# Patient Record
Sex: Female | Born: 1945
Health system: Southern US, Community
[De-identification: ages and names within clinical notes are randomized; demographics above are authoritative.]

## PROBLEM LIST (undated history)

## (undated) DIAGNOSIS — Z8619 Personal history of other infectious and parasitic diseases: Secondary | ICD-10-CM

## (undated) DIAGNOSIS — Z98811 Dental restoration status: Secondary | ICD-10-CM

## (undated) DIAGNOSIS — M65832 Other synovitis and tenosynovitis, left forearm: Secondary | ICD-10-CM

## (undated) DIAGNOSIS — E785 Hyperlipidemia, unspecified: Secondary | ICD-10-CM

## (undated) HISTORY — PX: TENDON REPAIR: SHX5111

## (undated) HISTORY — PX: REPAIR ANKLE LIGAMENT: SUR1187

## (undated) HISTORY — PX: INCONTINENCE SURGERY: SHX676

## (undated) HISTORY — DX: Hyperlipidemia, unspecified: E78.5

## (undated) HISTORY — PX: KNEE ARTHROSCOPY W/ MENISCAL REPAIR: SHX1877

## (undated) HISTORY — PX: EYE FOREIGN BODY REMOVAL: SHX1562

---

## 2008-08-28 LAB — HM COLONOSCOPY: HM Colonoscopy: NORMAL

## 2011-02-10 ENCOUNTER — Ambulatory Visit (INDEPENDENT_AMBULATORY_CARE_PROVIDER_SITE_OTHER): Payer: BC Managed Care – PPO | Admitting: Family Medicine

## 2011-02-10 ENCOUNTER — Encounter: Payer: Self-pay | Admitting: Family Medicine

## 2011-02-10 DIAGNOSIS — IMO0001 Reserved for inherently not codable concepts without codable children: Secondary | ICD-10-CM | POA: Insufficient documentation

## 2011-02-10 DIAGNOSIS — J309 Allergic rhinitis, unspecified: Secondary | ICD-10-CM | POA: Insufficient documentation

## 2011-02-10 DIAGNOSIS — R030 Elevated blood-pressure reading, without diagnosis of hypertension: Secondary | ICD-10-CM

## 2011-02-10 DIAGNOSIS — E785 Hyperlipidemia, unspecified: Secondary | ICD-10-CM

## 2011-02-10 LAB — BASIC METABOLIC PANEL
CO2: 28 mEq/L (ref 19–32)
Calcium: 9.5 mg/dL (ref 8.4–10.5)
Glucose, Bld: 81 mg/dL (ref 70–99)
Sodium: 142 mEq/L (ref 135–145)

## 2011-02-10 LAB — LIPID PANEL
HDL: 45.8 mg/dL (ref >39.00)
LDL Cholesterol: 104 mg/dL — ABNORMAL HIGH (ref 0–99)
Total CHOL/HDL Ratio: 4
VLDL: 24.2 mg/dL (ref 0.0–40.0)

## 2011-02-10 LAB — CBC WITH DIFFERENTIAL/PLATELET
Eosinophils Relative: 1.7 % (ref 0.0–5.0)
HCT: 36.2 % (ref 36.0–46.0)
Hemoglobin: 12.6 g/dL (ref 12.0–15.0)
Lymphs Abs: 2.3 10*3/uL (ref 0.7–4.0)
Monocytes Relative: 8.7 % (ref 3.0–12.0)
Neutro Abs: 2.2 10*3/uL (ref 1.4–7.7)
RBC: 4.15 Mil/uL (ref 3.87–5.11)
WBC: 5 10*3/uL (ref 4.5–10.5)

## 2011-02-10 LAB — HEPATIC FUNCTION PANEL
Albumin: 3.8 g/dL (ref 3.5–5.2)
Alkaline Phosphatase: 42 U/L (ref 39–117)
Total Protein: 6.4 g/dL (ref 6.0–8.3)

## 2011-02-10 MED ORDER — FLUTICASONE PROPIONATE 50 MCG/ACT NA SUSP: 2.0000 | Freq: Every day | NASAL | Status: DC

## 2011-02-10 MED ORDER — ATORVASTATIN CALCIUM 10 MG PO TABS: 10.0000 mg | ORAL_TABLET | Freq: Every day | ORAL | Status: DC

## 2011-02-10 NOTE — Assessment & Plan Note (Signed)
BP is borderline today.  Pt is asymptomatic.  Will follow closely and start meds prn.

## 2011-02-10 NOTE — Assessment & Plan Note (Signed)
This is cause of pt's hoarseness and cough.  Start nasal spray daily.  OTC zyrtec.  Reviewed supportive care and red flags that should prompt return.  Pt expressed understanding and is in agreement w/ plan.

## 2011-02-10 NOTE — Progress Notes (Signed)
  Subjective:    Patient ID: Kristen Huffman, female    DOB: 23-Jul-1946, 65 y.o.   MRN: [id number]  HPI New to establish.  Previous MD- [facility].  GYN- [facility], UTD on pap, mammo.  Colonoscopy 2010.  Hyperlipidemia- chronic problem for pt, on Lipitor.  Due for labs.  No abd pain, N/V, myalgias  HTN- new problem for pt, was started on Lisinopril HCT by previous MD and this caused her palms to itch.  Pt reports she has never had problems w/ BP.  No CP, SOB, HAs, visual changes, edema.  Rhinitis- reports she has had sxs since November.  'i've had 3 shots, a round of abx, a box of allergy pills and nothing is working'.  Having hoarseness, nasal congestion, PND, fullness in ears.  No fevers or chills.  Previously on Fluticasone- uses sparingly.   Review of Systems For ROS see HPI     Objective:   Physical Exam  Constitutional: She appears well-developed and well-nourished. No distress.  HENT:  Head: Normocephalic and atraumatic.  Right Ear: Tympanic membrane normal.  Left Ear: Tympanic membrane normal.  Nose: Mucosal edema and rhinorrhea present. No sinus tenderness. Right sinus exhibits no maxillary sinus tenderness and no frontal sinus tenderness. Left sinus exhibits no maxillary sinus tenderness and no frontal sinus tenderness.  Mouth/Throat: Mucous membranes are normal.       + PND, hoarseness  Eyes: Conjunctivae and EOM are normal. Pupils are equal, round, and reactive to light.  Neck: Normal range of motion. Neck supple. No thyromegaly present.  Cardiovascular: Normal rate, regular rhythm, normal heart sounds and intact distal pulses.   No murmur heard. Pulmonary/Chest: Effort normal and breath sounds normal. No respiratory distress. She has no wheezes. She has no rales.  Abdominal: Soft. Bowel sounds are normal. She exhibits no distension. There is no tenderness.  Musculoskeletal: She exhibits no edema.  Lymphadenopathy:    She has no cervical adenopathy.  Neurological: She is  alert. She has normal reflexes. No cranial nerve deficit.  Skin: Skin is warm and dry.          Assessment & Plan:

## 2011-02-10 NOTE — Assessment & Plan Note (Signed)
Due for labs today.  Adjust meds based on results.

## 2011-02-10 NOTE — Patient Instructions (Signed)
Follow up in 3 months to recheck blood pressure Use the Flonase EVERY DAY!!! Take Zyrtec daily for allergy symptoms We'll notify you of your lab results Call with any questions or concerns Hang in there!!!

## 2011-02-11 ENCOUNTER — Encounter: Payer: Self-pay | Admitting: *Deleted

## 2011-03-04 ENCOUNTER — Ambulatory Visit: Payer: BC Managed Care – PPO | Admitting: Family Medicine

## 2011-03-05 ENCOUNTER — Ambulatory Visit (INDEPENDENT_AMBULATORY_CARE_PROVIDER_SITE_OTHER): Payer: BC Managed Care – PPO | Admitting: Family Medicine

## 2011-03-05 ENCOUNTER — Encounter: Payer: Self-pay | Admitting: Family Medicine

## 2011-03-05 VITALS — BP 120/80 | Temp 97.8°F | Wt 160.2 lb

## 2011-03-05 DIAGNOSIS — J309 Allergic rhinitis, unspecified: Secondary | ICD-10-CM

## 2011-03-05 DIAGNOSIS — R300 Dysuria: Secondary | ICD-10-CM

## 2011-03-05 LAB — POCT URINALYSIS DIPSTICK
Leukocytes, UA: NEGATIVE
Nitrite, UA: NEGATIVE
Protein, UA: NEGATIVE
Urobilinogen, UA: 0.2
pH, UA: 5

## 2011-03-05 NOTE — Assessment & Plan Note (Signed)
UA normal.  No evidence of infxn.  No abx at this time.  Encouraged increased fluids.

## 2011-03-05 NOTE — Patient Instructions (Signed)
Follow up in 3 months to recheck blood pressure There is no sign of bladder infection Continue the Zyrtec and Nasonex daily Call with any questions or concerns Have a great summer!

## 2011-03-05 NOTE — Progress Notes (Signed)
  Subjective:    Patient ID: Kristen Huffman, female    DOB: 20-Nov-1945, 65 y.o.   MRN: [id number]  HPI Allergic rhinitis- sxs have improved since starting nasal spray and taking zyrtec regularly.  sxs are still bad at night and 1st thing in AM.  ? UTI- had sensation of needing to void this AM but did not urinate, had some 'stinging'.  Denies frequency, urgency.  Review of Systems For ROS see HPI     Objective:   Physical Exam  Constitutional: She appears well-developed and well-nourished. No distress.  HENT:  Head: Normocephalic and atraumatic.  Mouth/Throat: Oropharynx is clear and moist.       Less edematous turbinates  Eyes: Conjunctivae and EOM are normal. Pupils are equal, round, and reactive to light.  Abdominal: Soft. Bowel sounds are normal. She exhibits no distension. There is no tenderness (no suprapubic or CVA).          Assessment & Plan:

## 2011-03-05 NOTE — Assessment & Plan Note (Signed)
Pt's sxs are improving w/ daily tx.  Pt to continue current meds.  Will follow.

## 2011-08-13 ENCOUNTER — Ambulatory Visit: Payer: BC Managed Care – PPO | Admitting: Family Medicine

## 2011-08-24 ENCOUNTER — Ambulatory Visit: Payer: BC Managed Care – PPO | Admitting: Family Medicine

## 2011-08-27 ENCOUNTER — Ambulatory Visit: Payer: BC Managed Care – PPO | Admitting: Family Medicine

## 2011-09-03 ENCOUNTER — Telehealth: Payer: Self-pay | Admitting: *Deleted

## 2011-09-03 ENCOUNTER — Ambulatory Visit (INDEPENDENT_AMBULATORY_CARE_PROVIDER_SITE_OTHER): Payer: BC Managed Care – PPO | Admitting: Family Medicine

## 2011-09-03 ENCOUNTER — Encounter: Payer: Self-pay | Admitting: Family Medicine

## 2011-09-03 VITALS — BP 122/80 | HR 60 | Temp 97.9°F | Ht 66.0 in | Wt 163.8 lb

## 2011-09-03 DIAGNOSIS — E785 Hyperlipidemia, unspecified: Secondary | ICD-10-CM

## 2011-09-03 DIAGNOSIS — Z23 Encounter for immunization: Secondary | ICD-10-CM

## 2011-09-03 DIAGNOSIS — R030 Elevated blood-pressure reading, without diagnosis of hypertension: Secondary | ICD-10-CM

## 2011-09-03 DIAGNOSIS — Z0000 Encounter for general adult medical examination without abnormal findings: Secondary | ICD-10-CM

## 2011-09-03 LAB — CBC WITH DIFFERENTIAL/PLATELET
Eosinophils Absolute: 0.1 10*3/uL (ref 0.0–0.7)
Eosinophils Relative: 2 % (ref 0–5)
HCT: 40.9 % (ref 36.0–46.0)
Lymphocytes Relative: 41 % (ref 12–46)
Lymphs Abs: 2 10*3/uL (ref 0.7–4.0)
MCH: 28.2 pg (ref 26.0–34.0)
MCV: 86.8 fL (ref 78.0–100.0)
Monocytes Absolute: 0.5 10*3/uL (ref 0.1–1.0)
Platelets: 204 10*3/uL (ref 150–400)
RBC: 4.71 MIL/uL (ref 3.87–5.11)
WBC: 4.9 10*3/uL (ref 4.0–10.5)

## 2011-09-03 LAB — BASIC METABOLIC PANEL
BUN: 16 mg/dL (ref 6–23)
CO2: 25 mEq/L (ref 19–32)
Calcium: 9.7 mg/dL (ref 8.4–10.5)
Chloride: 106 mEq/L (ref 96–112)
Creat: 0.62 mg/dL (ref 0.50–1.10)
Glucose, Bld: 73 mg/dL (ref 70–99)

## 2011-09-03 LAB — HEPATIC FUNCTION PANEL
ALT: 19 U/L (ref 0–35)
Albumin: 4.3 g/dL (ref 3.5–5.2)
Total Protein: 6.8 g/dL (ref 6.0–8.3)

## 2011-09-03 LAB — LIPID PANEL
Cholesterol: 184 mg/dL (ref 0–200)
Triglycerides: 104 mg/dL (ref <150)
VLDL: 21 mg/dL (ref 0–40)

## 2011-09-03 LAB — TSH: TSH: 1.52 u[IU]/mL (ref 0.350–4.500)

## 2011-09-03 NOTE — Assessment & Plan Note (Signed)
Normal BP today.  Asymptomatic.  No need for meds.

## 2011-09-03 NOTE — Telephone Encounter (Signed)
refaxed release for pt information to be sent to [place] from [facility].

## 2011-09-03 NOTE — Progress Notes (Signed)
  Subjective:    Patient ID: Kristen Huffman, female    DOB: [birth year], 65 y.o.   MRN: [id number]  HPI Hyperlipidemia- chronic problem, on Lipitor nightly.  No abd pain, N/V, myalgias.  Hx of hepatitis.  Elevated BP- pt had been started on ACE by previous MD but had itching from this.  Since med was stopped she has not had elevated BP.  Denies CP, SOB, HAs, visual changes, edema.   Review of Systems For ROS see HPI     Objective:   Physical Exam  Vitals reviewed. Constitutional: She is oriented to person, place, and time. She appears well-developed and well-nourished. No distress.  HENT:  Head: Normocephalic and atraumatic.  Eyes: Conjunctivae and EOM are normal. Pupils are equal, round, and reactive to light.  Neck: Normal range of motion. Neck supple. No thyromegaly present.  Cardiovascular: Normal rate, regular rhythm, normal heart sounds and intact distal pulses.   No murmur heard. Pulmonary/Chest: Effort normal and breath sounds normal. No respiratory distress.  Abdominal: Soft. She exhibits no distension. There is no tenderness.  Musculoskeletal: She exhibits no edema.  Lymphadenopathy:    She has no cervical adenopathy.  Neurological: She is alert and oriented to person, place, and time.  Skin: Skin is warm and dry.  Psychiatric: She has a normal mood and affect. Her behavior is normal.          Assessment & Plan:

## 2011-09-03 NOTE — Assessment & Plan Note (Signed)
Chronic problem.  On statin w/out difficulty.  Check labs.  Adjust meds prn

## 2011-09-03 NOTE — Patient Instructions (Signed)
Schedule your complete physical at your convenience- you can eat! We'll notify you of your lab results and make any changes if needed You look great!  Keep up the good work! Call with any questions or concerns Happy Holidays!!

## 2011-09-06 ENCOUNTER — Other Ambulatory Visit: Payer: Self-pay | Admitting: Family Medicine

## 2011-09-07 NOTE — Telephone Encounter (Signed)
rx sent to pharmacy by e-script  

## 2011-09-10 ENCOUNTER — Encounter: Payer: Self-pay | Admitting: *Deleted

## 2011-11-02 ENCOUNTER — Other Ambulatory Visit: Payer: Self-pay | Admitting: Family Medicine

## 2011-11-02 MED ORDER — PANTOPRAZOLE SODIUM 40 MG PO TBEC: 40.0000 mg | DELAYED_RELEASE_TABLET | Freq: Every day | ORAL | Status: DC

## 2011-11-02 NOTE — Telephone Encounter (Signed)
Called pt to verify whom had prescribed the medication per did not note the medication in epic or centricity med list, pt advised that she has been on this medication for years per prior MD, MD Beverely Low advised we can send medication for pt, sent to verified pharmacy via escribe

## 2011-11-05 ENCOUNTER — Telehealth: Payer: Self-pay | Admitting: *Deleted

## 2011-11-05 NOTE — Telephone Encounter (Signed)
Left message to call office to see if Pt has tried any OTC meds like: Prilosec/omeprazole, zantac, Tums, aciphex or any other med for acid reflux/GERD.  Awaiting PA fax form.

## 2011-11-06 NOTE — Telephone Encounter (Signed)
Left message to call office

## 2011-11-10 NOTE — Telephone Encounter (Signed)
Pt indicated that she has tried tums, aciphex, and nexium with no relief. Pt also notes that med was originally Rx for GERD.PA faxed awaiting response.

## 2011-11-11 ENCOUNTER — Telehealth: Payer: Self-pay | Admitting: Family Medicine

## 2011-11-23 NOTE — Telephone Encounter (Signed)
Prior Auth approved 10-20-11 until 11-09-12.

## 2012-01-06 ENCOUNTER — Other Ambulatory Visit: Payer: Self-pay | Admitting: Family Medicine

## 2012-01-06 MED ORDER — ATORVASTATIN CALCIUM 10 MG PO TABS: 10.0000 mg | ORAL_TABLET | Freq: Every day | ORAL | Status: DC

## 2012-01-06 NOTE — Telephone Encounter (Signed)
rx sent to pharmacy by e-script  

## 2012-01-06 NOTE — Telephone Encounter (Signed)
Refill for  Atorvastatin 10MG  Tablet Qty 30 Take 1-tablet every day  Last filled 3.11.13   Last OV 12.6.12

## 2012-03-07 ENCOUNTER — Ambulatory Visit (INDEPENDENT_AMBULATORY_CARE_PROVIDER_SITE_OTHER): Payer: BC Managed Care – PPO | Admitting: Family Medicine

## 2012-03-07 ENCOUNTER — Other Ambulatory Visit: Payer: Self-pay | Admitting: Family Medicine

## 2012-03-07 ENCOUNTER — Encounter: Payer: Self-pay | Admitting: Family Medicine

## 2012-03-07 VITALS — BP 124/78 | HR 62 | Temp 97.9°F | Ht 65.5 in | Wt 163.0 lb

## 2012-03-07 DIAGNOSIS — E785 Hyperlipidemia, unspecified: Secondary | ICD-10-CM

## 2012-03-07 DIAGNOSIS — Z2911 Encounter for prophylactic immunotherapy for respiratory syncytial virus (RSV): Secondary | ICD-10-CM

## 2012-03-07 DIAGNOSIS — Z0000 Encounter for general adult medical examination without abnormal findings: Secondary | ICD-10-CM

## 2012-03-07 DIAGNOSIS — K219 Gastro-esophageal reflux disease without esophagitis: Secondary | ICD-10-CM

## 2012-03-07 LAB — BASIC METABOLIC PANEL
CO2: 28 mEq/L (ref 19–32)
Glucose, Bld: 79 mg/dL (ref 70–99)
Potassium: 3.9 mEq/L (ref 3.5–5.1)
Sodium: 144 mEq/L (ref 135–145)

## 2012-03-07 LAB — LIPID PANEL
HDL: 43.2 mg/dL (ref >39.00)
LDL Cholesterol: 117 mg/dL — ABNORMAL HIGH (ref 0–99)
Total CHOL/HDL Ratio: 4
Triglycerides: 118 mg/dL (ref 0.0–149.0)

## 2012-03-07 LAB — CBC WITH DIFFERENTIAL/PLATELET
Basophils Absolute: 0.1 10*3/uL (ref 0.0–0.1)
Eosinophils Relative: 2.8 % (ref 0.0–5.0)
HCT: 38.2 % (ref 36.0–46.0)
Lymphocytes Relative: 41.9 % (ref 12.0–46.0)
Monocytes Relative: 9 % (ref 3.0–12.0)
Neutrophils Relative %: 45.2 % (ref 43.0–77.0)
Platelets: 183 10*3/uL (ref 150.0–400.0)
RDW: 13.4 % (ref 11.5–14.6)
WBC: 5.2 10*3/uL (ref 4.5–10.5)

## 2012-03-07 LAB — HEPATIC FUNCTION PANEL
AST: 25 U/L (ref 0–37)
Albumin: 4 g/dL (ref 3.5–5.2)
Alkaline Phosphatase: 66 U/L (ref 39–117)
Bilirubin, Direct: 0.1 mg/dL (ref 0.0–0.3)
Total Protein: 6.8 g/dL (ref 6.0–8.3)

## 2012-03-07 NOTE — Patient Instructions (Signed)
Follow up in 6 months to recheck cholesterol Your exam looks great!  Keep up the good work! We'll notify you of your lab results and make any changes if needed We'll call you with your GI appt Continue the Protonix for now Call with any questions or concerns Have a great summer!

## 2012-03-07 NOTE — Assessment & Plan Note (Signed)
Chronic problem.  Check labs.  Adjust meds prn  

## 2012-03-07 NOTE — Progress Notes (Signed)
  Subjective:    Patient ID: Kristen Huffman, female    DOB: 10/11/1945, 66 y.o.   MRN: [id number]  HPI CPE- UTD on GYN screening, UTD on colonoscopy.  Desires shingles shot.   Review of Systems Patient reports no vision/ hearing changes, adenopathy,fever, weight change,  persistant/recurrent hoarseness , swallowing issues, chest pain, palpitations, edema, persistant/recurrent cough, hemoptysis, dyspnea (rest/exertional/paroxysmal nocturnal), gastrointestinal bleeding (melena, rectal bleeding), abdominal pain, bowel changes, GU symptoms (dysuria, hematuria, incontinence), Gyn symptoms (abnormal  bleeding, pain),  syncope, focal weakness, memory loss, numbness & tingling, skin/hair/nail changes, abnormal bruising or bleeding, anxiety, or depression.  + GERD- worsening sxs recently despite ongoing use of Protonix    Objective:   Physical Exam General Appearance:    Alert, cooperative, no distress, appears stated age  Head:    Normocephalic, without obvious abnormality, atraumatic  Eyes:    PERRL, conjunctiva/corneas clear, EOM's intact, fundi    benign, both eyes  Ears:    Normal TM's and external ear canals, both ears  Nose:   Nares normal, septum midline, mucosa normal, no drainage    or sinus tenderness  Throat:   Lips, mucosa, and tongue normal; teeth and gums normal  Neck:   Supple, symmetrical, trachea midline, no adenopathy;    Thyroid: no enlargement/tenderness/nodules  Back:     Symmetric, no curvature, ROM normal, no CVA tenderness  Lungs:     Clear to auscultation bilaterally, respirations unlabored  Chest Wall:    No tenderness or deformity   Heart:    Regular rate and rhythm, S1 and S2 normal, no murmur, rub   or gallop  Breast Exam:    Deferred to GYN  Abdomen:     Soft, non-tender, bowel sounds active all four quadrants,    no masses, no organomegaly  Genitalia:    Deferred to GYN  Rectal:    Extremities:   Extremities normal, atraumatic, no cyanosis or edema  Pulses:   2+  and symmetric all extremities  Skin:   Skin color, texture, turgor normal, no rashes or lesions  Lymph nodes:   Cervical, supraclavicular, and axillary nodes normal  Neurologic:   CNII-XII intact, normal strength, sensation and reflexes    throughout          Assessment & Plan:

## 2012-03-07 NOTE — Telephone Encounter (Signed)
Refill Atorvastatin 10mg  Tablet Qty 30 Take one tablet every day  Last fill 5.7.13 Last ov 6.10.13

## 2012-03-07 NOTE — Assessment & Plan Note (Signed)
Deteriorated despite use of protonix regularly.  Pt was seeing GI regularly at [facility].  Will refer to [place].  Continue PPI.  Pt expressed understanding and is in agreement w/ plan.

## 2012-03-07 NOTE — Assessment & Plan Note (Signed)
New.  Pt's PE WNL.  UTD on GYN, colonoscopy.  Check labs.  EKG done- see document for interpretation.  Anticipatory guidance provided.

## 2012-03-08 ENCOUNTER — Encounter: Payer: Self-pay | Admitting: *Deleted

## 2012-03-08 MED ORDER — ATORVASTATIN CALCIUM 10 MG PO TABS: 10.0000 mg | ORAL_TABLET | Freq: Every day | ORAL | Status: DC

## 2012-03-08 NOTE — Telephone Encounter (Signed)
rx sent to pharmacy by e-script  

## 2012-03-10 ENCOUNTER — Encounter: Payer: Self-pay | Admitting: *Deleted

## 2012-03-10 LAB — VITAMIN D 1,25 DIHYDROXY
Vitamin D 1, 25 (OH)2 Total: 49 pg/mL (ref 18–72)
Vitamin D2 1, 25 (OH)2: <8 pg/mL

## 2012-03-29 ENCOUNTER — Ambulatory Visit (INDEPENDENT_AMBULATORY_CARE_PROVIDER_SITE_OTHER): Payer: BC Managed Care – PPO | Admitting: Gastroenterology

## 2012-03-29 ENCOUNTER — Encounter: Payer: Self-pay | Admitting: Gastroenterology

## 2012-03-29 VITALS — BP 150/80 | HR 72 | Ht 66.0 in | Wt 165.0 lb

## 2012-03-29 DIAGNOSIS — Z860100 Personal history of colon polyps, unspecified: Secondary | ICD-10-CM

## 2012-03-29 DIAGNOSIS — K219 Gastro-esophageal reflux disease without esophagitis: Secondary | ICD-10-CM

## 2012-03-29 NOTE — Progress Notes (Signed)
HPI: This is a     very pleasant 66 year old woman whom I am meeting for the first time today.  She has had probably 2-3 EGDs.  He told her that if she didn't keep up with EGDs periodically she may develop Barrett's.  Two colonoscopies with Dr. Noe Gens, polyps were found.  She believes 1-2 small polyps were found, was recommended to have repeat colonoscopy in 2 year interval.  We don't have any of those records here for review. She tells me she has found it very difficult to get records from his office. She is frankly lost a lot of trust in his office and in him personally.  CBC and complete metabolic profile last month were both completely normal.  Eating caused pyrosis.  Certain food foods (spicey) will still cause problems.  Overeating can cause pyrosis..  Takes protonix daily.  Has been on PPI for 10 years.  Usually at bedtime.    Became hoarse last spring, took zyrtec without imrovement.  Recent cipro for UTI cured her chronic hoarseness.  No dypshagia.  She is gaining weight slowly or stable in her usual range.      Review of systems: Pertinent positive and negative review of systems were noted in the above HPI section. Complete review of systems was performed and was otherwise normal.    Past Medical History  Diagnosis Date  . History of chicken pox   . GERD (gastroesophageal reflux disease)   . Hyperlipidemia   . Urinary incontinence   . Hepatitis 1970    B, jaundice  . Hypertension     Past Surgical History  Procedure Date  . Incontinence surgery   . Rineck   . Uterine biopsy 1978  . Eye surgery 1968  . Broken toe     Current Outpatient Prescriptions  Medication Sig Dispense Refill  . Ascorbic Acid (VITAMIN C PO) Take 600 Units by mouth daily.        Marland Kitchen atorvastatin (LIPITOR) 10 MG tablet Take 1 tablet (10 mg total) by mouth daily.  30 tablet  5  . co-enzyme Q-10 30 MG capsule Take 30 mg by mouth daily.        . fluticasone (FLONASE) 50 MCG/ACT nasal spray  Place 2 sprays into the nose daily.      . Multiple Vitamin (MULTIVITAMIN) tablet Take 1 tablet by mouth daily.        . pantoprazole (PROTONIX) 40 MG tablet Take 1 tablet (40 mg total) by mouth daily.  30 tablet  6  . VITAMIN E PO Take 2,000 Units by mouth daily.        Marland Kitchen omega-3 acid ethyl esters (LOVAZA) 1 G capsule Take 1 g by mouth 2 (two) times daily.          Allergies as of 03/29/2012 - Review Complete 03/29/2012  Allergen Reaction Noted  . Codeine  02/10/2011  . Penicillins Hives 02/10/2011    Family History  Problem Relation Age of Onset  . Heart attack Father 50  . Hypertension Mother   . Heart failure Mother     History   Social History  . Marital Status: Married    Spouse Name: N/A    Number of Children: N/A  . Years of Education: N/A   Occupational History  . Not on file.   Social History Main Topics  . Smoking status: Former Smoker    Quit date: 09/29/1979  . Smokeless tobacco: Not on file  . Alcohol Use: No  .  Drug Use: No  . Sexually Active:    Other Topics Concern  . Not on file   Social History Narrative  . No narrative on file       Physical Exam: BP 150/80  Pulse 72  Ht 5\' 6"  (1.676 m)  Wt 165 lb (74.844 kg)  BMI 26.63 kg/m2 Constitutional: generally well-appearing Psychiatric: alert and oriented x3 Eyes: extraocular movements intact Mouth: oral pharynx moist, no lesions Neck: supple no lymphadenopathy Cardiovascular: heart regular rate and rhythm Lungs: clear to auscultation bilaterally Abdomen: soft, nontender, nondistended, no obvious ascites, no peritoneal signs, normal bowel sounds Extremities: no lower extremity edema bilaterally Skin: no lesions on visible extremities    Assessment and plan: 66 y.o. female with  chronic GERD without alarm symptoms, personal history of polyps in the colon  We're going to get records sent over from her previous gastroenterology office. She has lost faith and trust in him and in his  office. I'll review his records and determine if she needs EGDs for surveillance. Also we'll decide proper timing for colonoscopy recall. Going to have her try to cut back on her proton pump inhibitor to every other day and she will use H2 blocker as needed for symptoms, especially going out for spicy food. She will return to see me in 4-5 weeks and sooner if needed.

## 2012-03-29 NOTE — Patient Instructions (Addendum)
We are working on getting records from Dr. Rodena Piety office (colonoscopy and EGDs, including biopsy results).  Will decide on further upper lower endoscopies after reviewing. You should change the way you are taking your antiacid medicine (protonix) so that you are taking it 20-30 minutes prior to a decent meal as that is the way the pill is designed to work most effectively.  Try decreasing to every other day. Pepcid/zantac is another class of antiacid meds, very good for taking just prior a meal or right at bedtime. Return to see Dr. Christella Hartigan in 5-6 weeks to discuss your symptoms.

## 2012-04-01 ENCOUNTER — Telehealth: Payer: Self-pay

## 2012-04-01 ENCOUNTER — Telehealth: Payer: Self-pay | Admitting: Gastroenterology

## 2012-04-01 NOTE — Telephone Encounter (Signed)
Kristen Huffman is calling today and asking that records be sent ASAP

## 2012-04-01 NOTE — Telephone Encounter (Signed)
EGD December 2001 Dr. Noe Gens, handwritten EGD note that appears to state she had abnormal mucosa 2 cm above GE junction and gastritis, normal duodenum. Biopsies performed. Consistent with Barrett's esophagus without atypia. Repeat EGD December 2003 esophagitis noted in distal 4 cm of esophagus with gastritis. Biopsies showed chronic inflammation of gastric type mucosa without intestinal metaplasia. Repeat EGD December 2005 describes Barrett's esophagus for distal 4 cm of esophagus. Gastric ulcer. Biopsies showed no intestinal metaplasia and once again no H. pylori. EGD June 2006 describes exactly the same thing as in 2005 biopsies were again taken from the stomach and the esophagus the again showed no H. pylori and no intestinal metaplasia. EGD June 2009 describes Barrett's esophagus distal 4 cm, gastric polyps and gastritis. Biopsies again showed no H. pylori no intestinal metaplasia and only hyperplastic polyps. She was recommended to have a repeat EGD in either 2 or 3 years, I cannot interpret the handwritten note on the pathologic slept.  Colonoscopy December 2001 done for blood loss, 10 mm polyp from 20 cm into the sigmoid. This was a hyperplastic polyp on pathology. Repeat colonoscopy July 2009 noted another 10 mm polyp at 18 cm into the sigmoid. This was done for "personal history of colon polyps in the past" the polyp removed was again found to be hyperplastic however she was recommended to have a repeat colonoscopy in 6 months due to limited view of the colon due to inadequate prep. Colonoscopy December 2009 found 2 more small sessile polyps 8 mm and 7 mm. These were both hyperplastic and it appears he recommended repeat colonoscopy in 10 years.  [name], Please call her, I reviewed the records from Dr. Noe Gens extensive, numerous upper and lower endoscopies. She had a single biopsy showing intestinal metaplasia in 2001 however on for endoscopies since then she has not been found to have any intestinal  metaplasia. I think in truth she probably does not have Barrett's esophagus currently. I recommend she does not need any further EGD surveillances. She has had 3 colonoscopies the most recent in 2009 and has never been found to have a precancerous polyp and so I agree with Dr. Noe Gens that she repeat needs another colonoscopy December 2019 for routine risk screening.  Recall colonsocopy 08/2008

## 2012-04-01 NOTE — Telephone Encounter (Signed)
Message copied by Donata Duff on Fri Apr 01, 2012 11:12 AM ------      Message from: Donata Duff      Created: Tue Mar 29, 2012  2:45 PM       Check on records from Dr Noe Gens

## 2012-04-04 NOTE — Telephone Encounter (Signed)
Pt recall in EPIC unable to reach pt Left message on machine to call back

## 2012-04-26 ENCOUNTER — Telehealth: Payer: Self-pay | Admitting: Family Medicine

## 2012-04-26 NOTE — Telephone Encounter (Signed)
Pt has a new grandchild on the way and would like to know if she has ahd it & if not does she need it? Patient callback [phone]

## 2012-04-26 NOTE — Telephone Encounter (Signed)
Spoke to scheduler whom clarified pt needs TDAP with whopping cough, advised pt does not have this shot and can come into office to have the shot, please advise if pt can get here by 5pm this nurse will do it today

## 2012-04-28 NOTE — Telephone Encounter (Signed)
Called pt she can come 8.12.13 at 2pm - sch

## 2012-05-09 ENCOUNTER — Ambulatory Visit: Payer: BC Managed Care – PPO

## 2012-05-16 NOTE — Telephone Encounter (Signed)
No showed for appt

## 2012-05-27 ENCOUNTER — Encounter: Payer: BC Managed Care – PPO | Admitting: Family Medicine

## 2012-06-06 ENCOUNTER — Ambulatory Visit (INDEPENDENT_AMBULATORY_CARE_PROVIDER_SITE_OTHER): Payer: BC Managed Care – PPO | Admitting: Family Medicine

## 2012-06-06 ENCOUNTER — Encounter: Payer: Self-pay | Admitting: Family Medicine

## 2012-06-06 VITALS — BP 130/75 | HR 63 | Temp 98.2°F | Ht 65.75 in | Wt 161.4 lb

## 2012-06-06 DIAGNOSIS — R21 Rash and other nonspecific skin eruption: Secondary | ICD-10-CM

## 2012-06-06 DIAGNOSIS — L255 Unspecified contact dermatitis due to plants, except food: Secondary | ICD-10-CM

## 2012-06-06 DIAGNOSIS — L237 Allergic contact dermatitis due to plants, except food: Secondary | ICD-10-CM | POA: Insufficient documentation

## 2012-06-06 MED ORDER — PREDNISONE 20 MG PO TABS: ORAL_TABLET | ORAL | Status: DC

## 2012-06-06 MED ORDER — METHYLPREDNISOLONE ACETATE 80 MG/ML IJ SUSP
80.0000 mg | Freq: Once | INTRAMUSCULAR | Status: AC
  Administered 2012-06-06: 80 mg via INTRAMUSCULAR

## 2012-06-06 NOTE — Patient Instructions (Addendum)
Start the Prednisone tomorrow w/ breakfast Take all 3 pills at the same time OTC hydrocortisone cream as needed Call with any questions or concerns Hang in there!

## 2012-06-06 NOTE — Assessment & Plan Note (Signed)
New.  Due to severity and diffuse distribution will start steroid taper after depo medrol injxn today in office.  Reviewed supportive care and red flags that should prompt return.  Pt expressed understanding and is in agreement w/ plan.

## 2012-06-06 NOTE — Progress Notes (Signed)
  Subjective:    Patient ID: Kristen Huffman, female    DOB: 05-31-1946, 66 y.o.   MRN: [id number]  HPI Poison ivy- was pulling weeds on Tuesday and developed diffuse and severe rash on Wednesday/Thursday.  Very itchy on face, neck, chest, arms.  No fevers.  No drainage.   Review of Systems For ROS see HPI     Objective:   Physical Exam  Vitals reviewed. Constitutional: She appears well-developed and well-nourished. No distress.  Skin: Skin is warm and dry. Rash (pt w/ linear rash, some vesicles, in various stages of drying on face, neck, wrists, chest) noted. There is erythema.          Assessment & Plan:

## 2012-09-06 ENCOUNTER — Ambulatory Visit (INDEPENDENT_AMBULATORY_CARE_PROVIDER_SITE_OTHER): Payer: BC Managed Care – PPO | Admitting: Family Medicine

## 2012-09-06 ENCOUNTER — Encounter: Payer: Self-pay | Admitting: Family Medicine

## 2012-09-06 VITALS — BP 130/80 | HR 61 | Temp 97.8°F | Ht 65.25 in | Wt 162.8 lb

## 2012-09-06 DIAGNOSIS — Z23 Encounter for immunization: Secondary | ICD-10-CM

## 2012-09-06 DIAGNOSIS — E785 Hyperlipidemia, unspecified: Secondary | ICD-10-CM

## 2012-09-06 DIAGNOSIS — K219 Gastro-esophageal reflux disease without esophagitis: Secondary | ICD-10-CM

## 2012-09-06 LAB — HEPATIC FUNCTION PANEL
AST: 22 U/L (ref 0–37)
Albumin: 4.2 g/dL (ref 3.5–5.2)
Alkaline Phosphatase: 59 U/L (ref 39–117)
Bilirubin, Direct: 0.1 mg/dL (ref 0.0–0.3)
Total Protein: 7 g/dL (ref 6.0–8.3)

## 2012-09-06 LAB — LIPID PANEL
HDL: 42.1 mg/dL (ref >39.00)
Total CHOL/HDL Ratio: 4
VLDL: 29.4 mg/dL (ref 0.0–40.0)

## 2012-09-06 NOTE — Assessment & Plan Note (Signed)
Improved.  Pt has weaned off all meds.  Currently asymptomatic.  Will continue to follow.

## 2012-09-06 NOTE — Progress Notes (Signed)
  Subjective:    Patient ID: Kristen Huffman, female    DOB: Oct 19, 1945, 66 y.o.   MRN: [id number]  HPI Hyperlipidemia- chronic problem, on Lipitor.  Denies abd pain, N/V, myalgias.  Exercising regularly.  GERD- no longer on meds, currently symptom free.   Review of Systems For ROS see HPI     Objective:   Physical Exam  Constitutional: She is oriented to person, place, and time. She appears well-developed and well-nourished. No distress.  HENT:  Head: Normocephalic and atraumatic.  Eyes: Conjunctivae normal and EOM are normal. Pupils are equal, round, and reactive to light.  Neck: Normal range of motion. Neck supple. No thyromegaly present.  Cardiovascular: Normal rate, regular rhythm, normal heart sounds and intact distal pulses.   No murmur heard. Pulmonary/Chest: Effort normal and breath sounds normal. No respiratory distress.  Abdominal: Soft. She exhibits no distension. There is no tenderness.  Musculoskeletal: She exhibits no edema.  Lymphadenopathy:    She has no cervical adenopathy.  Neurological: She is alert and oriented to person, place, and time.  Skin: Skin is warm and dry.  Psychiatric: She has a normal mood and affect. Her behavior is normal.          Assessment & Plan:

## 2012-09-06 NOTE — Patient Instructions (Addendum)
Schedule your complete physical in 6 months We'll notify you of your lab results and make any changes if needed Call with any questions or concerns Happy Holidays!!! 

## 2012-09-06 NOTE — Assessment & Plan Note (Signed)
Chronic problem.  Tolerating statin w/out difficulty.  Check labs.  Adjust meds prn  

## 2012-09-09 ENCOUNTER — Encounter: Payer: Self-pay | Admitting: *Deleted

## 2012-09-26 ENCOUNTER — Telehealth: Payer: Self-pay | Admitting: Family Medicine

## 2012-09-26 NOTE — Telephone Encounter (Signed)
Refill: Atorvastatin 10 mg tablet. Take 1 tablet by mouth daily. Qty 30. Last fill 08-30-12

## 2012-09-27 ENCOUNTER — Other Ambulatory Visit: Payer: Self-pay | Admitting: *Deleted

## 2012-09-27 DIAGNOSIS — E785 Hyperlipidemia, unspecified: Secondary | ICD-10-CM

## 2012-09-27 MED ORDER — ATORVASTATIN CALCIUM 10 MG PO TABS: 10.0000 mg | ORAL_TABLET | Freq: Every day | ORAL | Status: DC

## 2012-09-27 NOTE — Telephone Encounter (Signed)
Refill for atorvastatin sent to pharmacy.  

## 2012-12-12 ENCOUNTER — Encounter (INDEPENDENT_AMBULATORY_CARE_PROVIDER_SITE_OTHER): Payer: BC Managed Care – PPO | Admitting: Ophthalmology

## 2012-12-12 DIAGNOSIS — H43819 Vitreous degeneration, unspecified eye: Secondary | ICD-10-CM

## 2012-12-12 DIAGNOSIS — H2510 Age-related nuclear cataract, unspecified eye: Secondary | ICD-10-CM

## 2013-03-21 ENCOUNTER — Telehealth: Payer: Self-pay | Admitting: Family Medicine

## 2013-03-21 NOTE — Telephone Encounter (Signed)
Patient states that [facility] faxed our office a medical clearance form for her surgery that they are wanting to set up for next week. Patient is needing to know as soon as possible if she needs an office visit in order for Dr. Beverely Low to give her clearance.

## 2013-03-22 NOTE — Telephone Encounter (Signed)
Patient is calling back. She cannot schedule surgery until clearance sheet is signed. Does patient need to come in for this?

## 2013-03-22 NOTE — Telephone Encounter (Signed)
Spoke with the pt and informed her that she will need to schedule an appt for Medical Clearance.  Pt agreed and scheduled an appt.//AB/CMA

## 2013-03-28 ENCOUNTER — Encounter: Payer: Self-pay | Admitting: Family Medicine

## 2013-03-28 ENCOUNTER — Ambulatory Visit (INDEPENDENT_AMBULATORY_CARE_PROVIDER_SITE_OTHER): Payer: BC Managed Care – PPO | Admitting: Family Medicine

## 2013-03-28 VITALS — BP 140/86 | HR 67 | Wt 163.0 lb

## 2013-03-28 DIAGNOSIS — Z0000 Encounter for general adult medical examination without abnormal findings: Secondary | ICD-10-CM

## 2013-03-28 DIAGNOSIS — E785 Hyperlipidemia, unspecified: Secondary | ICD-10-CM

## 2013-03-28 DIAGNOSIS — R030 Elevated blood-pressure reading, without diagnosis of hypertension: Secondary | ICD-10-CM

## 2013-03-28 LAB — BASIC METABOLIC PANEL
BUN: 16 mg/dL (ref 6–23)
CO2: 28 mEq/L (ref 19–32)
Chloride: 105 mEq/L (ref 96–112)
GFR: 100.88 mL/min (ref >60.00)
Glucose, Bld: 107 mg/dL — ABNORMAL HIGH (ref 70–99)
Potassium: 3.7 mEq/L (ref 3.5–5.1)
Sodium: 138 mEq/L (ref 135–145)

## 2013-03-28 LAB — LIPID PANEL
HDL: 41.9 mg/dL (ref >39.00)
LDL Cholesterol: 126 mg/dL — ABNORMAL HIGH (ref 0–99)
Total CHOL/HDL Ratio: 5
Triglycerides: 152 mg/dL — ABNORMAL HIGH (ref 0.0–149.0)
VLDL: 30.4 mg/dL (ref 0.0–40.0)

## 2013-03-28 LAB — HEPATIC FUNCTION PANEL
ALT: 22 U/L (ref 0–35)
AST: 23 U/L (ref 0–37)
Albumin: 4 g/dL (ref 3.5–5.2)
Alkaline Phosphatase: 62 U/L (ref 39–117)
Total Protein: 7.1 g/dL (ref 6.0–8.3)

## 2013-03-28 LAB — CBC WITH DIFFERENTIAL/PLATELET
Basophils Relative: 0.9 % (ref 0.0–3.0)
Eosinophils Relative: 3.3 % (ref 0.0–5.0)
MCV: 85.5 fl (ref 78.0–100.0)
Monocytes Absolute: 0.4 10*3/uL (ref 0.1–1.0)
Monocytes Relative: 7.1 % (ref 3.0–12.0)
Neutrophils Relative %: 49.5 % (ref 43.0–77.0)
Platelets: 188 10*3/uL (ref 150.0–400.0)
RBC: 4.34 Mil/uL (ref 3.87–5.11)
WBC: 5.3 10*3/uL (ref 4.5–10.5)

## 2013-03-28 LAB — TSH: TSH: 1.94 u[IU]/mL (ref 0.35–5.50)

## 2013-03-28 NOTE — Progress Notes (Signed)
Subjective:    Kristen Huffman is a 67 y.o. female who presents to the office today for a preoperative consultation at the request of surgeon Dr Ranell Patrick who plans on performing R knee meniscectomy on TBD  . This consultation is requested for the specific conditions prompting preoperative evaluation (i.e. because of potential affect on operative risk): none. Planned anesthesia: general. The patient has the following known anesthesia issues: no hx of complications. Patients bleeding risk: no recent abnormal bleeding and no remote history of abnormal bleeding. Patient does not have objections to receiving blood products if needed.  The following portions of the patient's history were reviewed and updated as appropriate: allergies, current medications, past family history, past medical history, past social history, past surgical history and problem list.  Review of Systems A comprehensive review of systems was negative.    Objective:    BP 140/86  Pulse 67  Wt 163 lb (73.936 kg)  BMI 26.93 kg/m2  SpO2 98%  General Appearance:    Alert, cooperative, no distress, appears stated age  Head:    Normocephalic, without obvious abnormality, atraumatic  Eyes:    PERRL, conjunctiva/corneas clear, EOM's intact, fundi    benign, both eyes  Ears:    Normal TM's and external ear canals, both ears  Nose:   Nares normal, septum midline, mucosa normal, no drainage    or sinus tenderness  Throat:   Lips, mucosa, and tongue normal; teeth and gums normal  Neck:   Supple, symmetrical, trachea midline, no adenopathy;    thyroid:  no enlargement/tenderness/nodules; no carotid   bruit or JVD  Back:     Symmetric, no curvature, ROM normal, no CVA tenderness  Lungs:     Clear to auscultation bilaterally, respirations unlabored  Chest Wall:    No tenderness or deformity   Heart:    Regular rate and rhythm, S1 and S2 normal, no murmur, rub   or gallop  Breast Exam:    deferred  Abdomen:     Soft, non-tender, bowel  sounds active all four quadrants,    no masses, no organomegaly  Genitalia:    deferred  Rectal:    Extremities:   Extremities normal, atraumatic, no cyanosis or edema  Pulses:   2+ and symmetric all extremities  Skin:   Skin color, texture, turgor normal, no rashes or lesions  Lymph nodes:   Cervical, supraclavicular, and axillary nodes normal  Neurologic:   CNII-XII intact, normal strength, sensation and reflexes    throughout    Predictors of intubation difficulty:  Morbid obesity? no  Anatomically abnormal facies? no  Prominent incisors? no  Receding mandible? no  Short, thick neck? no  Neck range of motion: normal  Dentition: No chipped, loose, or missing teeth.  Cardiographics ECG: normal sinus rhythm, no blocks or conduction defects, no ischemic changes Echocardiogram: not done  Imaging Chest x-ray: NA   Lab Review  No visits with results within 6 Month(s) from this visit. Latest known visit with results is:  Office Visit on 09/06/2012  Component Date Value  . Total Bilirubin 09/06/2012 0.6   . Bilirubin, Direct 09/06/2012 0.1   . Alkaline Phosphatase 09/06/2012 59   . AST 09/06/2012 22   . ALT 09/06/2012 21   . Total Protein 09/06/2012 7.0   . Albumin 09/06/2012 4.2   . Cholesterol 09/06/2012 186   . Triglycerides 09/06/2012 147.0   . HDL 09/06/2012 42.10   . VLDL 09/06/2012 29.4   . LDL Cholesterol  09/06/2012 115*  . Total CHOL/HDL Ratio 09/06/2012 4       Assessment:      67 y.o. female with planned surgery as above.   Known risk factors for perioperative complications: None   Difficulty with intubation is not anticipated.  Cardiac Risk Estimation: low  Current medications which may produce withdrawal symptoms if withheld perioperatively: none      Plan:    1. Preoperative workup as follows ECG, hemoglobin, hematocrit, electrolytes, creatinine. 2. Change in medication regimen before surgery: discontinue ASA 14 days before surgery and discontinue  NSAIDs (OTC meds) 14 days before surgery. 3. Prophylaxis for cardiac events with perioperative beta-blockers: should be considered, specific regimen per anesthesia. 4. Invasive hemodynamic monitoring perioperatively: at the discretion of anesthesiologist. 5. Deep vein thrombosis prophylaxis postoperatively:regimen to be chosen by surgical team. 6. Surveillance for postoperative MI with ECG immediately postoperatively and on postoperative days 1 and 2 AND troponin levels 24 hours postoperatively and on day 4 or hospital discharge (whichever comes first): at the discretion of anesthesiologist. 7. Other measures: none

## 2013-03-28 NOTE — Patient Instructions (Addendum)
Schedule your complete physical at your convenience We'll fax everything over to surgery and they'll get you scheduled Call with any questions or concerns We'll notify you of your lab results GOOD LUCK!

## 2013-03-29 ENCOUNTER — Encounter: Payer: Self-pay | Admitting: *Deleted

## 2013-04-01 LAB — VITAMIN D 1,25 DIHYDROXY: Vitamin D 1, 25 (OH)2 Total: 34 pg/mL (ref 18–72)

## 2013-04-28 LAB — HM MAMMOGRAPHY: HM Mammogram: NORMAL

## 2013-04-28 LAB — HM PAP SMEAR: HM PAP: NORMAL

## 2013-04-30 ENCOUNTER — Other Ambulatory Visit: Payer: Self-pay | Admitting: Family Medicine

## 2013-05-23 ENCOUNTER — Ambulatory Visit: Payer: BC Managed Care – PPO | Attending: Orthopedic Surgery | Admitting: Rehabilitation

## 2013-05-23 DIAGNOSIS — IMO0001 Reserved for inherently not codable concepts without codable children: Secondary | ICD-10-CM | POA: Insufficient documentation

## 2013-05-23 DIAGNOSIS — M25669 Stiffness of unspecified knee, not elsewhere classified: Secondary | ICD-10-CM | POA: Insufficient documentation

## 2013-05-24 ENCOUNTER — Ambulatory Visit (INDEPENDENT_AMBULATORY_CARE_PROVIDER_SITE_OTHER): Payer: BC Managed Care – PPO | Admitting: Family Medicine

## 2013-05-24 ENCOUNTER — Encounter: Payer: Self-pay | Admitting: Family Medicine

## 2013-05-24 VITALS — BP 126/82 | HR 65 | Temp 97.9°F | Ht 66.0 in | Wt 156.6 lb

## 2013-05-24 DIAGNOSIS — Z0000 Encounter for general adult medical examination without abnormal findings: Secondary | ICD-10-CM

## 2013-05-24 LAB — CBC WITH DIFFERENTIAL/PLATELET
Eosinophils Absolute: 0.1 10*3/uL (ref 0.0–0.7)
Eosinophils Relative: 2.4 % (ref 0.0–5.0)
HCT: 38.2 % (ref 36.0–46.0)
Lymphs Abs: 2.1 10*3/uL (ref 0.7–4.0)
MCHC: 33.9 g/dL (ref 30.0–36.0)
MCV: 84 fl (ref 78.0–100.0)
Monocytes Absolute: 0.5 10*3/uL (ref 0.1–1.0)
Platelets: 195 10*3/uL (ref 150.0–400.0)
WBC: 5.1 10*3/uL (ref 4.5–10.5)

## 2013-05-24 NOTE — Assessment & Plan Note (Signed)
Pt's PE WNL.  UTD on colonoscopy and GYN.  Check labs.  Anticipatory guidance provided.

## 2013-05-24 NOTE — Progress Notes (Signed)
  Subjective:    Patient ID: Kristen Huffman, female    DOB: January 14, 1946, 67 y.o.   MRN: [id number]  HPI CPE- UTD on colonoscopy.  GYN- Arnette Schaumann, UTD.   Review of Systems Patient reports no vision/ hearing changes, adenopathy,fever, weight change,  persistant/recurrent hoarseness , swallowing issues, chest pain, palpitations, edema, persistant/recurrent cough, hemoptysis, dyspnea (rest/exertional/paroxysmal nocturnal), gastrointestinal bleeding (melena, rectal bleeding), abdominal pain, significant heartburn, bowel changes, GU symptoms (dysuria, hematuria, incontinence), Gyn symptoms (abnormal  bleeding, pain),  syncope, focal weakness, memory loss, numbness & tingling, skin/hair/nail changes, abnormal bruising or bleeding, anxiety, or depression.     Objective:   Physical Exam General Appearance:    Alert, cooperative, no distress, appears stated age  Head:    Normocephalic, without obvious abnormality, atraumatic  Eyes:    PERRL, conjunctiva/corneas clear, EOM's intact, fundi    benign, both eyes  Ears:    Normal TM's and external ear canals, both ears  Nose:   Nares normal, septum midline, mucosa normal, no drainage    or sinus tenderness  Throat:   Lips, mucosa, and tongue normal; teeth and gums normal  Neck:   Supple, symmetrical, trachea midline, no adenopathy;    Thyroid: no enlargement/tenderness/nodules  Back:     Symmetric, no curvature, ROM normal, no CVA tenderness  Lungs:     Clear to auscultation bilaterally, respirations unlabored  Chest Wall:    No tenderness or deformity   Heart:    Regular rate and rhythm, S1 and S2 normal, no murmur, rub   or gallop  Breast Exam:    Deferred to GYN  Abdomen:     Soft, non-tender, bowel sounds active all four quadrants,    no masses, no organomegaly  Genitalia:    Deferred to GYN  Rectal:    Extremities:   Extremities normal, atraumatic, no cyanosis or edema  Pulses:   2+ and symmetric all extremities  Skin:   Skin color, texture,  turgor normal, no rashes or lesions  Lymph nodes:   Cervical, supraclavicular, and axillary nodes normal  Neurologic:   CNII-XII intact, normal strength, sensation and reflexes    throughout          Assessment & Plan:

## 2013-05-24 NOTE — Patient Instructions (Addendum)
Follow up in 6 months to recheck BP and cholesterol We'll notify you of your lab results and make any changes if needed Keep up the good work!  You look great! Happy [place] Ouch!!!

## 2013-05-25 LAB — BASIC METABOLIC PANEL
BUN: 13 mg/dL (ref 6–23)
Glucose, Bld: 84 mg/dL (ref 70–99)
Sodium: 141 mEq/L (ref 135–145)

## 2013-05-25 LAB — HEPATIC FUNCTION PANEL
ALT: 19 U/L (ref 0–35)
AST: 21 U/L (ref 0–37)
Bilirubin, Direct: 0.1 mg/dL (ref 0.0–0.3)
Total Protein: 6.8 g/dL (ref 6.0–8.3)

## 2013-05-25 LAB — LIPID PANEL
Cholesterol: 164 mg/dL (ref 0–200)
LDL Cholesterol: 97 mg/dL (ref 0–99)
Total CHOL/HDL Ratio: 4
VLDL: 27 mg/dL (ref 0.0–40.0)

## 2013-05-26 ENCOUNTER — Ambulatory Visit: Payer: BC Managed Care – PPO | Admitting: Rehabilitation

## 2013-05-26 ENCOUNTER — Encounter: Payer: Self-pay | Admitting: General Practice

## 2013-05-27 LAB — VITAMIN D 1,25 DIHYDROXY
Vitamin D 1, 25 (OH)2 Total: 46 pg/mL (ref 18–72)
Vitamin D2 1, 25 (OH)2: <8 pg/mL
Vitamin D3 1, 25 (OH)2: 46 pg/mL

## 2013-05-30 ENCOUNTER — Encounter: Payer: Self-pay | Admitting: General Practice

## 2013-05-31 ENCOUNTER — Ambulatory Visit: Payer: BC Managed Care – PPO | Attending: Orthopedic Surgery | Admitting: Rehabilitation

## 2013-05-31 DIAGNOSIS — IMO0001 Reserved for inherently not codable concepts without codable children: Secondary | ICD-10-CM | POA: Insufficient documentation

## 2013-05-31 DIAGNOSIS — M25669 Stiffness of unspecified knee, not elsewhere classified: Secondary | ICD-10-CM | POA: Insufficient documentation

## 2013-06-05 ENCOUNTER — Ambulatory Visit: Payer: BC Managed Care – PPO | Admitting: Rehabilitation

## 2013-06-05 ENCOUNTER — Encounter: Payer: BC Managed Care – PPO | Admitting: Rehabilitation

## 2013-06-06 ENCOUNTER — Encounter: Payer: BC Managed Care – PPO | Admitting: Rehabilitation

## 2013-06-08 ENCOUNTER — Encounter: Payer: BC Managed Care – PPO | Admitting: Rehabilitation

## 2013-06-09 ENCOUNTER — Ambulatory Visit: Payer: BC Managed Care – PPO | Admitting: Rehabilitation

## 2013-06-12 ENCOUNTER — Ambulatory Visit: Payer: BC Managed Care – PPO | Admitting: Rehabilitation

## 2013-06-14 ENCOUNTER — Ambulatory Visit: Payer: BC Managed Care – PPO | Admitting: Rehabilitation

## 2013-07-05 ENCOUNTER — Telehealth: Payer: Self-pay | Admitting: *Deleted

## 2013-07-05 NOTE — Telephone Encounter (Signed)
Unfortunately pt needs appt prior to getting abx prescription

## 2013-07-05 NOTE — Telephone Encounter (Signed)
Left message on voicemail stating that an appointment would need to be made

## 2013-07-05 NOTE — Telephone Encounter (Signed)
Pt called and stated that she has a sinus infection. Pt is going out of town and was wondering if she can get an antibiotic for this. Please advise.. SW, CMA

## 2013-09-28 LAB — HM COLONOSCOPY

## 2013-10-30 ENCOUNTER — Other Ambulatory Visit: Payer: Self-pay | Admitting: Family Medicine

## 2013-10-30 NOTE — Telephone Encounter (Signed)
Med filled.  

## 2013-11-24 ENCOUNTER — Ambulatory Visit (INDEPENDENT_AMBULATORY_CARE_PROVIDER_SITE_OTHER): Payer: 59 | Admitting: Family Medicine

## 2013-11-24 ENCOUNTER — Ambulatory Visit: Payer: 59 | Admitting: Family Medicine

## 2013-11-24 ENCOUNTER — Encounter: Payer: Self-pay | Admitting: Family Medicine

## 2013-11-24 VITALS — BP 118/78 | HR 67 | Temp 98.2°F | Resp 16 | Wt 161.2 lb

## 2013-11-24 DIAGNOSIS — IMO0001 Reserved for inherently not codable concepts without codable children: Secondary | ICD-10-CM

## 2013-11-24 DIAGNOSIS — R030 Elevated blood-pressure reading, without diagnosis of hypertension: Secondary | ICD-10-CM

## 2013-11-24 DIAGNOSIS — E785 Hyperlipidemia, unspecified: Secondary | ICD-10-CM

## 2013-11-24 LAB — HEPATIC FUNCTION PANEL
ALT: 24 U/L (ref 0–35)
AST: 23 U/L (ref 0–37)
Albumin: 4.1 g/dL (ref 3.5–5.2)
Alkaline Phosphatase: 72 U/L (ref 39–117)
BILIRUBIN DIRECT: 0.1 mg/dL (ref 0.0–0.3)
TOTAL PROTEIN: 7.2 g/dL (ref 6.0–8.3)
Total Bilirubin: 0.5 mg/dL (ref 0.3–1.2)

## 2013-11-24 LAB — LIPID PANEL
Cholesterol: 187 mg/dL (ref 0–200)
HDL: 38.7 mg/dL — ABNORMAL LOW (ref >39.00)
LDL CALC: 112 mg/dL — AB (ref 0–99)
TRIGLYCERIDES: 180 mg/dL — AB (ref 0.0–149.0)
Total CHOL/HDL Ratio: 5
VLDL: 36 mg/dL (ref 0.0–40.0)

## 2013-11-24 LAB — BASIC METABOLIC PANEL
BUN: 13 mg/dL (ref 6–23)
CHLORIDE: 105 meq/L (ref 96–112)
CO2: 30 mEq/L (ref 19–32)
CREATININE: 0.6 mg/dL (ref 0.4–1.2)
Calcium: 9.8 mg/dL (ref 8.4–10.5)
GFR: 100.67 mL/min (ref >60.00)
Glucose, Bld: 82 mg/dL (ref 70–99)
POTASSIUM: 4.2 meq/L (ref 3.5–5.1)
Sodium: 140 mEq/L (ref 135–145)

## 2013-11-24 NOTE — Assessment & Plan Note (Signed)
Chronic problem.  Tolerating statin w/o difficulty.  Check labs.  Adjust meds prn  

## 2013-11-24 NOTE — Progress Notes (Signed)
   Subjective:    Patient ID: Kristen Huffman, female    DOB: 01/17/1946, 68 y.o.   MRN: [id number]  HPI Elevated BP- excellent control today.  Asymptomatic.  No CP, SOB, HAs, visual changes, edema.  Hyperlipidemia- chronic problem, on Lipitor daily.  Denies abd pain, N/V, myalgias.   Review of Systems For ROS see HPI     Objective:   Physical Exam  Vitals reviewed. Constitutional: She is oriented to person, place, and time. She appears well-developed and well-nourished. No distress.  HENT:  Head: Normocephalic and atraumatic.  Eyes: Conjunctivae and EOM are normal. Pupils are equal, round, and reactive to light.  Neck: Normal range of motion. Neck supple. No thyromegaly present.  Cardiovascular: Normal rate, regular rhythm, normal heart sounds and intact distal pulses.   No murmur heard. Pulmonary/Chest: Effort normal and breath sounds normal. No respiratory distress.  Abdominal: Soft. She exhibits no distension. There is no tenderness.  Musculoskeletal: She exhibits no edema.  Lymphadenopathy:    She has no cervical adenopathy.  Neurological: She is alert and oriented to person, place, and time.  Skin: Skin is warm and dry.  Psychiatric: She has a normal mood and affect. Her behavior is normal.          Assessment & Plan:

## 2013-11-24 NOTE — Progress Notes (Signed)
Pre visit review using our clinic review tool, if applicable. No additional management support is needed unless otherwise documented below in the visit note. 

## 2013-11-24 NOTE — Assessment & Plan Note (Signed)
BP is excellent today!  Asymptomatic.  No changes.

## 2013-11-24 NOTE — Patient Instructions (Signed)
Schedule your complete physical in 6 months Keep up the good work!  You look great! We'll notify you of your lab results and make any changes if needed Call with any questions or concerns [place]!!!

## 2013-12-04 ENCOUNTER — Telehealth: Payer: Self-pay | Admitting: *Deleted

## 2013-12-04 NOTE — Telephone Encounter (Signed)
Agree w/ advice given 

## 2013-12-04 NOTE — Telephone Encounter (Signed)
Patient shared that on Thursday night she and her husband had eaten a [facility] and later that night she had gotten sick (N/V).  Friday morning the diarrhea started without N/V.  She felt better Sunday, but the diarrhea returned today. Patient was encouraged to continue taking Immodium if it's helping.  Alternatives such as Kaopectate and Pepto-Bismol were also suggested.  Patient was also encouraged to increase her fiber intake and to drink plenty of fluids.  Patient stated understanding, and no further questions or concerns were voiced.

## 2013-12-04 NOTE — Telephone Encounter (Signed)
Patient called and stated that she has had diarrhea off and on since Friday night (12/01/2013). She states she has been taking immodium and would like to know what else she could try. Please advise. JG//CMA

## 2014-01-06 ENCOUNTER — Other Ambulatory Visit: Payer: Self-pay | Admitting: Family Medicine

## 2014-01-08 NOTE — Telephone Encounter (Signed)
Med filled.  

## 2014-01-09 NOTE — Telephone Encounter (Signed)
error 

## 2014-01-29 ENCOUNTER — Telehealth: Payer: Self-pay | Admitting: Family Medicine

## 2014-01-29 NOTE — Telephone Encounter (Signed)
Patient called and requested for her labs to be fax to her house. Labs were faxed to [phone]

## 2014-03-15 ENCOUNTER — Other Ambulatory Visit: Payer: Self-pay | Admitting: Family Medicine

## 2014-03-15 NOTE — Telephone Encounter (Signed)
Med filled.  

## 2014-05-08 ENCOUNTER — Other Ambulatory Visit: Payer: Self-pay | Admitting: Family Medicine

## 2014-05-08 NOTE — Telephone Encounter (Signed)
Medication filled, CEP 05/28/2014.

## 2014-05-25 ENCOUNTER — Encounter: Payer: 59 | Admitting: Family Medicine

## 2014-05-28 ENCOUNTER — Encounter: Payer: Self-pay | Admitting: Family Medicine

## 2014-05-28 ENCOUNTER — Ambulatory Visit (INDEPENDENT_AMBULATORY_CARE_PROVIDER_SITE_OTHER): Payer: 59 | Admitting: Family Medicine

## 2014-05-28 ENCOUNTER — Encounter: Payer: Self-pay | Admitting: General Practice

## 2014-05-28 VITALS — BP 124/78 | HR 57 | Temp 98.0°F | Resp 16 | Ht 66.25 in | Wt 163.1 lb

## 2014-05-28 DIAGNOSIS — E785 Hyperlipidemia, unspecified: Secondary | ICD-10-CM

## 2014-05-28 DIAGNOSIS — Z0000 Encounter for general adult medical examination without abnormal findings: Secondary | ICD-10-CM

## 2014-05-28 DIAGNOSIS — Z23 Encounter for immunization: Secondary | ICD-10-CM

## 2014-05-28 LAB — BASIC METABOLIC PANEL
BUN: 17 mg/dL (ref 6–23)
CALCIUM: 9.5 mg/dL (ref 8.4–10.5)
CO2: 27 mEq/L (ref 19–32)
CREATININE: 0.7 mg/dL (ref 0.4–1.2)
Chloride: 106 mEq/L (ref 96–112)
GFR: 89.01 mL/min (ref >60.00)
Glucose, Bld: 85 mg/dL (ref 70–99)
Potassium: 3.9 mEq/L (ref 3.5–5.1)
Sodium: 141 mEq/L (ref 135–145)

## 2014-05-28 LAB — CBC WITH DIFFERENTIAL/PLATELET
Basophils Absolute: 0 10*3/uL (ref 0.0–0.1)
Basophils Relative: 0.8 % (ref 0.0–3.0)
Eosinophils Absolute: 0.1 10*3/uL (ref 0.0–0.7)
Eosinophils Relative: 3.1 % (ref 0.0–5.0)
HEMATOCRIT: 38.8 % (ref 36.0–46.0)
Hemoglobin: 13 g/dL (ref 12.0–15.0)
Lymphocytes Relative: 44.6 % (ref 12.0–46.0)
Lymphs Abs: 2.2 10*3/uL (ref 0.7–4.0)
MCHC: 33.4 g/dL (ref 30.0–36.0)
MCV: 85 fl (ref 78.0–100.0)
MONOS PCT: 9.2 % (ref 3.0–12.0)
Monocytes Absolute: 0.4 10*3/uL (ref 0.1–1.0)
NEUTROS PCT: 42.3 % — AB (ref 43.0–77.0)
Neutro Abs: 2 10*3/uL (ref 1.4–7.7)
Platelets: 193 10*3/uL (ref 150.0–400.0)
RBC: 4.57 Mil/uL (ref 3.87–5.11)
RDW: 13.9 % (ref 11.5–15.5)
WBC: 4.8 10*3/uL (ref 4.0–10.5)

## 2014-05-28 LAB — HEPATIC FUNCTION PANEL
ALBUMIN: 4 g/dL (ref 3.5–5.2)
ALT: 22 U/L (ref 0–35)
AST: 23 U/L (ref 0–37)
Alkaline Phosphatase: 62 U/L (ref 39–117)
Bilirubin, Direct: 0 mg/dL (ref 0.0–0.3)
Total Bilirubin: 0.5 mg/dL (ref 0.2–1.2)
Total Protein: 7.2 g/dL (ref 6.0–8.3)

## 2014-05-28 LAB — LIPID PANEL
CHOLESTEROL: 189 mg/dL (ref 0–200)
HDL: 43.1 mg/dL (ref >39.00)
LDL Cholesterol: 126 mg/dL — ABNORMAL HIGH (ref 0–99)
NonHDL: 145.9
TRIGLYCERIDES: 101 mg/dL (ref 0.0–149.0)
Total CHOL/HDL Ratio: 4
VLDL: 20.2 mg/dL (ref 0.0–40.0)

## 2014-05-28 LAB — VITAMIN D 25 HYDROXY (VIT D DEFICIENCY, FRACTURES): VITD: 55.57 ng/mL (ref 30.00–100.00)

## 2014-05-28 LAB — TSH: TSH: 1.59 u[IU]/mL (ref 0.35–4.50)

## 2014-05-28 NOTE — Assessment & Plan Note (Signed)
Chronic problem, tolerating statin w/o difficulty.  Check labs.  Adjust meds prn  

## 2014-05-28 NOTE — Addendum Note (Signed)
Addended by: Jackson Latino on: 05/28/2014 09:45 AM   Modules accepted: Orders

## 2014-05-28 NOTE — Assessment & Plan Note (Addendum)
Pt's PE WNL.  UTD on GYN, colonoscopy.  Written screening schedule updated and given to pt.  Check labs.  EKG done- see document for interpretation, unchanged from previous.  Anticipatory guidance provided.

## 2014-05-28 NOTE — Patient Instructions (Signed)
Follow up in 6 months to recheck cholesterol We'll notify you of your lab results and make any changes if needed Keep up the good work on healthy diet and regular exercise Call with any questions or concerns Happy Labor Day!!!

## 2014-05-28 NOTE — Progress Notes (Signed)
   Subjective:    Patient ID: Kristen Huffman, female    DOB: [birth year], 67 y.o.   MRN: [id number]  HPI Here today for CPE.  Risk Factors: Hyperlipidemia- chronic problem, on Lipitor.  Denies abd pain, N/V, myalgias Physical Activity: walking regularly Fall Risk: low risk Depression: denies current sxs Hearing: normal to conversational tones and whispered voice at 6 ft ADL's: independent Cognitive: normal linear thought process, memory and attention intact Home Safety: safe at home, Height, Weight, BMI, Visual Acuity: see vitals, vision corrected to 20/20 w/ glasses Counseling: UTD on colonoscopy, pap, mammo (GYN- [facility]).  Due for pneumonia vaccine Labs Ordered: See A&P Care Plan: See A&P    Review of Systems Patient reports no vision/ hearing changes, adenopathy,fever, weight change,  persistant/recurrent hoarseness , swallowing issues, chest pain, palpitations, edema, persistant/recurrent cough, hemoptysis, dyspnea (rest/exertional/paroxysmal nocturnal), gastrointestinal bleeding (melena, rectal bleeding), abdominal pain, significant heartburn, bowel changes, GU symptoms (dysuria, hematuria, incontinence), Gyn symptoms (abnormal  bleeding, pain),  syncope, focal weakness, memory loss, numbness & tingling, skin/hair/nail changes, abnormal bruising or bleeding, anxiety, or depression.     Objective:   Physical Exam General Appearance:    Alert, cooperative, no distress, appears stated age  Head:    Normocephalic, without obvious abnormality, atraumatic  Eyes:    PERRL, conjunctiva/corneas clear, EOM's intact, fundi    benign, both eyes  Ears:    Normal TM's and external ear canals, both ears  Nose:   Nares normal, septum midline, mucosa normal, no drainage    or sinus tenderness  Throat:   Lips, mucosa, and tongue normal; teeth and gums normal  Neck:   Supple, symmetrical, trachea midline, no adenopathy;    Thyroid: no enlargement/tenderness/nodules  Back:     Symmetric, no  curvature, ROM normal, no CVA tenderness  Lungs:     Clear to auscultation bilaterally, respirations unlabored  Chest Wall:    No tenderness or deformity   Heart:    Regular rate and rhythm, S1 and S2 normal, no murmur, rub   or gallop  Breast Exam:    Deferred to GYN  Abdomen:     Soft, non-tender, bowel sounds active all four quadrants,    no masses, no organomegaly  Genitalia:    Deferred to GYN  Rectal:    Extremities:   Extremities normal, atraumatic, no cyanosis or edema  Pulses:   2+ and symmetric all extremities  Skin:   Skin color, texture, turgor normal, no rashes or lesions  Lymph nodes:   Cervical, supraclavicular, and axillary nodes normal  Neurologic:   CNII-XII intact, normal strength, sensation and reflexes    throughout          Assessment & Plan:

## 2014-05-31 ENCOUNTER — Telehealth: Payer: Self-pay | Admitting: Family Medicine

## 2014-05-31 NOTE — Telephone Encounter (Signed)
Chart updated

## 2014-05-31 NOTE — Telephone Encounter (Signed)
Caller name: Kaleiah  Relation to pt: self  Call back number: [phone]   Reason for call:   pt wanted to inform you Colposcopy was done 08/2008. She informed you incorrectly from previous conversation

## 2014-06-18 LAB — HM MAMMOGRAPHY: HM Mammogram: NORMAL

## 2014-06-18 LAB — HM PAP SMEAR: HM Pap smear: NORMAL

## 2014-07-10 ENCOUNTER — Telehealth: Payer: Self-pay | Admitting: *Deleted

## 2014-07-10 ENCOUNTER — Other Ambulatory Visit: Payer: Self-pay | Admitting: General Practice

## 2014-07-10 MED ORDER — ATORVASTATIN CALCIUM 10 MG PO TABS: ORAL_TABLET | ORAL | Status: DC

## 2014-07-10 NOTE — Telephone Encounter (Signed)
07/02/2014 received staff message stating pt requested information to send [facility] showing them she had her annual physical this year. 07/10/2014 spoke with pt. She had received fax from someone of information requested above, but the fax was blank.  I told patient I am faxing the office notes from her CPE 05/28/2014 to [phone], and also mailed the office notes to her [address] Box in chart.

## 2014-09-14 ENCOUNTER — Encounter: Payer: Self-pay | Admitting: Medical

## 2014-09-14 ENCOUNTER — Ambulatory Visit (INDEPENDENT_AMBULATORY_CARE_PROVIDER_SITE_OTHER): Payer: 59 | Admitting: Medical

## 2014-09-14 VITALS — BP 140/80 | HR 69 | Temp 97.7°F | Ht 65.0 in | Wt 160.0 lb

## 2014-09-14 DIAGNOSIS — J329 Chronic sinusitis, unspecified: Secondary | ICD-10-CM | POA: Insufficient documentation

## 2014-09-14 DIAGNOSIS — J0100 Acute maxillary sinusitis, unspecified: Secondary | ICD-10-CM

## 2014-09-14 MED ORDER — AZITHROMYCIN 250 MG PO TABS: ORAL_TABLET | ORAL | Status: DC

## 2014-09-14 MED ORDER — BENZONATATE 100 MG PO CAPS: 100.0000 mg | ORAL_CAPSULE | Freq: Three times a day (TID) | ORAL | Status: DC | PRN

## 2014-09-14 NOTE — Patient Instructions (Signed)
Your appear to have a sinus infection. I am prescribing  Azithromycin antibiotic for the infection. To help with the nasal congestion I prescribed nasal steroid flonase. For your associated cough, I prescribed cough medicine benzonatate.  Rest, hydrate, tylenol for fever.  Follow up in 7 days or as needed. 

## 2014-09-14 NOTE — Progress Notes (Signed)
Pre visit review using our clinic review tool, if applicable. No additional management support is needed unless otherwise documented below in the visit note. 

## 2014-09-14 NOTE — Progress Notes (Signed)
Subjective:    Patient ID: Kristen Huffman, female    DOB: 11/29/1945, 68 y.o.   MRN: [id number]  HPI   Pt in today reporting  cough, nasal congestion and runny nose for  7 Days.  Sinus pressure last 2-3 days. Some sinus infection hx.    Associated symptoms( below yes or no)  Fever-yes(early on) Chills-yes Chest congestion-yes, mild x 1 day Sneezing- no Itching eyes-no Sore throat- mild Post-nasal drainage-yes Wheezing-no Purulent drainage-no Fatigue-mild  Nonsmoker.  Past Medical History  Diagnosis Date  . History of chicken pox   . GERD (gastroesophageal reflux disease)   . Hyperlipidemia   . Urinary incontinence   . Hepatitis 1970    B, jaundice  . Hypertension     History   Social History  . Marital Status: Married    Spouse Name: N/A    Number of Children: N/A  . Years of Education: N/A   Occupational History  . Not on file.   Social History Main Topics  . Smoking status: Former Smoker    Quit date: 09/29/1979  . Smokeless tobacco: Not on file  . Alcohol Use: No  . Drug Use: No  . Sexual Activity: Not on file   Other Topics Concern  . Not on file   Social History Narrative    Past Surgical History  Procedure Laterality Date  . Incontinence surgery    . [place]ck    . Uterine biopsy  1978  . Eye surgery  1968  . Broken toe    . Knee surgery  April 10, 2013    Family History  Problem Relation Age of Onset  . Heart attack Father 2750  . Hypertension Mother   . Heart failure Mother     Allergies  Allergen Reactions  . Tetanus Toxoids     Pt notes mother almost died and was advised her children never get this injection  . Codeine     Facial swelling   . Penicillins Hives    Current Outpatient Prescriptions on File Prior to Visit  Medication Sig Dispense Refill  . Ascorbic Acid (VITAMIN C PO) Take 600 Units by mouth daily.      Marland Kitchen. atorvastatin (LIPITOR) 10 MG tablet TAKE 1 TABLET EVERY DAY 30 tablet 2  . Calcium Carbonate-Vitamin D  (CALCIUM 600 + D PO) Take 2 tablets by mouth daily.    . Cholecalciferol (VITAMIN D) 2000 UNITS tablet Take 2,000 Units by mouth daily.    . Cyanocobalamin (VITAMIN B-12 PO) Take 200 mcg by mouth daily.    Boris Lown. Krill Oil 300 MG CAPS Take 1 capsule by mouth daily.    . Multiple Vitamin (MULTIVITAMIN) tablet Take 1 tablet by mouth daily.       No current facility-administered medications on file prior to visit.    BP 140/80 mmHg  Pulse 69  Temp(Src) 97.7 F (36.5 C) (Oral)  Ht 5\' 5"  (1.651 m)  Wt 160 lb (72.576 kg)  BMI 26.63 kg/m2  SpO2 97%        Review of Systems  Constitutional: Positive for fever, chills and fatigue.       Early on fever.  HENT: Positive for congestion, postnasal drip, sinus pressure and sore throat.   Respiratory: Positive for cough. Negative for chest tightness and wheezing.   Cardiovascular: Negative for chest pain and palpitations.  Musculoskeletal: Negative for neck pain.  Neurological: Negative for dizziness and headaches.  Hematological: Negative for adenopathy. Does not bruise/bleed easily.  Objective:   Physical Exam   General  Mental Status - Alert. General Appearance - Well groomed. Not in acute distress.  Skin Rashes- No Rashes.  HEENT Head- Normal. Ear Auditory Canal - Left- Normal. Right - Normal.Tympanic Membrane- Left- Normal. Right- Normal. Eye Sclera/Conjunctiva- Left- Normal. Right- Normal. Nose & Sinuses Nasal Mucosa- Left-  Boggy and Congested. Right-  Boggy and  Congested.Bilateral maxillary and frontal sinus pressure. Mouth & Throat Lips: Upper Lip- Normal: no dryness, cracking, pallor, cyanosis, or vesicular eruption. Lower Lip-Normal: no dryness, cracking, pallor, cyanosis or vesicular eruption. Buccal Mucosa- Bilateral- No Aphthous ulcers. Oropharynx- No Discharge or Erythema. Tonsils: Characteristics- Bilateral- No Erythema or Congestion. Size/Enlargement- Bilateral- No enlargement. Discharge-  bilateral-None.  Neck Neck- Supple. No Masses.   Chest and Lung Exam Auscultation: Breath Sounds:-Clear even and unlabored.  Cardiovascular Auscultation:Rythm- Regular, rate and rhythm. Murmurs & Other Heart Sounds:Ausculatation of the heart reveal- No Murmurs.  Lymphatic Head & Neck General Head & Neck Lymphatics: Bilateral: Description- No Localized lymphadenopathy.         Assessment & Plan:

## 2014-09-14 NOTE — Assessment & Plan Note (Signed)
Your appear to have a sinus infection. I am prescribing  Azithromycin antibiotic for the infection. To help with the nasal congestion I prescribed nasal steroid flonase. For your associated cough, I prescribed cough medicine benzonatate.

## 2014-10-18 ENCOUNTER — Other Ambulatory Visit: Payer: Self-pay | Admitting: General Practice

## 2014-10-18 MED ORDER — ATORVASTATIN CALCIUM 10 MG PO TABS: ORAL_TABLET | ORAL | Status: DC

## 2014-10-28 ENCOUNTER — Emergency Department (HOSPITAL_BASED_OUTPATIENT_CLINIC_OR_DEPARTMENT_OTHER)
Admission: EM | Admit: 2014-10-28 | Discharge: 2014-10-28 | Disposition: A | Discharge status: Home / Self Care | Payer: Medicare Other | Attending: Emergency Medicine | Admitting: Emergency Medicine

## 2014-10-28 ENCOUNTER — Emergency Department (HOSPITAL_BASED_OUTPATIENT_CLINIC_OR_DEPARTMENT_OTHER): Payer: Medicare Other

## 2014-10-28 ENCOUNTER — Encounter (HOSPITAL_BASED_OUTPATIENT_CLINIC_OR_DEPARTMENT_OTHER): Payer: Self-pay | Admitting: *Deleted

## 2014-10-28 DIAGNOSIS — E785 Hyperlipidemia, unspecified: Secondary | ICD-10-CM | POA: Insufficient documentation

## 2014-10-28 DIAGNOSIS — Z792 Long term (current) use of antibiotics: Secondary | ICD-10-CM | POA: Insufficient documentation

## 2014-10-28 DIAGNOSIS — Y9289 Other specified places as the place of occurrence of the external cause: Secondary | ICD-10-CM | POA: Diagnosis not present

## 2014-10-28 DIAGNOSIS — Z8719 Personal history of other diseases of the digestive system: Secondary | ICD-10-CM | POA: Insufficient documentation

## 2014-10-28 DIAGNOSIS — Y9389 Activity, other specified: Secondary | ICD-10-CM | POA: Insufficient documentation

## 2014-10-28 DIAGNOSIS — Z79899 Other long term (current) drug therapy: Secondary | ICD-10-CM | POA: Insufficient documentation

## 2014-10-28 DIAGNOSIS — S6992XA Unspecified injury of left wrist, hand and finger(s), initial encounter: Secondary | ICD-10-CM | POA: Diagnosis present

## 2014-10-28 DIAGNOSIS — Z87891 Personal history of nicotine dependence: Secondary | ICD-10-CM | POA: Insufficient documentation

## 2014-10-28 DIAGNOSIS — Z8619 Personal history of other infectious and parasitic diseases: Secondary | ICD-10-CM | POA: Insufficient documentation

## 2014-10-28 DIAGNOSIS — Z880 Allergy status to penicillin: Secondary | ICD-10-CM | POA: Diagnosis not present

## 2014-10-28 DIAGNOSIS — I10 Essential (primary) hypertension: Secondary | ICD-10-CM | POA: Diagnosis not present

## 2014-10-28 DIAGNOSIS — S63502A Unspecified sprain of left wrist, initial encounter: Secondary | ICD-10-CM | POA: Insufficient documentation

## 2014-10-28 DIAGNOSIS — Y998 Other external cause status: Secondary | ICD-10-CM | POA: Diagnosis not present

## 2014-10-28 DIAGNOSIS — W231XXA Caught, crushed, jammed, or pinched between stationary objects, initial encounter: Secondary | ICD-10-CM | POA: Insufficient documentation

## 2014-10-28 MED ORDER — IBUPROFEN 400 MG PO TABS
400.0000 mg | ORAL_TABLET | Freq: Once | ORAL | Status: AC
  Administered 2014-10-28: 400 mg via ORAL
  Filled 2014-10-28: qty 1

## 2014-10-28 NOTE — Discharge Instructions (Signed)
Rest, Ice intermittently (in the first 24-48 hours), Gentle compression with an Ace wrap, and elevate (Limb above the level of the heart)   For pain control please take Ibuprofen (also known as Motrin or Advil) 400mg  (this is normally 2 over the counter pills) every 6 hours. Take with food to minimize stomach irritation.  Please follow with your primary care doctor in the next 2 days for a check-up. They must obtain records for further management.   Do not hesitate to return to the Emergency Department for any new, worsening or concerning symptoms.    Wrist Splint A wrist splint is a brace that holds your wrist in a fixed position. It can be used to stabilize your wrist so that broken bones and sprains can heal faster, with less pain. It can also help to relieve pressure on the nerve that runs down the middle of your arm (median nerve). Splints are available in drugstores without a prescription. They are also available by prescription from orthopedic and medical supply stores. Custom splints made from lightweight materials can be made by physical or occupational therapist. HOME CARE INSTRUCTIONS  Wear your splint as instructed by your caregiver. It may be worn while you sleep.  Your caregiver may instruct you how to perform certain exercises at home. These exercises help maintain muscle strength in your hand and wrist. They also help to maintain motion in your fingers. SEEK MEDICAL CARE IF:  You start to lose feeling in your hand or fingers.  Your skin or fingernails turn blue or gray, or they feel cold. MAKE SURE YOU:   Understand these instructions.  Will watch your condition.  Will get help right away if you are not doing well or get worse. Document Released: 08/27/2006 Document Revised: 12/07/2011 Document Reviewed: 12/26/2013 [facility] Patient Information 2015 [employer]. This information is not intended to replace advice given to you by your health care provider. Make sure you  discuss any questions you have with your health care provider.

## 2014-10-28 NOTE — ED Provider Notes (Signed)
CSN: [id number]     Arrival date & time 10/28/14  1618 History   First MD Initiated Contact with Patient 10/28/14 1645     Chief Complaint  Patient presents with  . Wrist Injury     (Consider location/radiation/quality/duration/timing/severity/associated sxs/prior Treatment) HPI   Kristen Huffman is a 69 y.o. female complaining of 6 out of 10 pain to left wrist after patient slammed the wrist in a metal door approximately 2 weeks ago. She has not been taking any pain medication. Patient is right-hand-dominant. Reasons to the ED today because pain is persistent.   Past Medical History  Diagnosis Date  . History of chicken pox   . GERD (gastroesophageal reflux disease)   . Hyperlipidemia   . Urinary incontinence   . Hepatitis 1970    B, jaundice  . Hypertension    Past Surgical History  Procedure Laterality Date  . Incontinence surgery    . [place]    . Uterine biopsy  1978  . Eye surgery  1968  . Broken toe    . Knee surgery  April 10, 2013   Family History  Problem Relation Age of Onset  . Heart attack Father 4450  . Hypertension Mother   . Heart failure Mother    History  Substance Use Topics  . Smoking status: Former Smoker    Quit date: 09/29/1979  . Smokeless tobacco: Not on file  . Alcohol Use: No   OB History    No data available     Review of Systems  10 systems reviewed and found to be negative, except as noted in the HPI.   Allergies  Tetanus toxoids; Codeine; and Penicillins  Home Medications   Prior to Admission medications   Medication Sig Start Date End Date Taking? Authorizing Provider  Ascorbic Acid (VITAMIN C PO) Take 600 Units by mouth daily.      Historical Provider, MD  atorvastatin (LIPITOR) 10 MG tablet TAKE 1 TABLET EVERY DAY 10/18/14   Sheliah HatchKatherine E Tabori, MD  azithromycin (ZITHROMAX) 250 MG tablet Take 2 tablets by mouth on day 1, followed by 1 tablet by mouth daily for 4 days. 09/14/14   Bayard BeaverEdward M Saguier, PA-C  benzonatate (TESSALON) 100  MG capsule Take 1 capsule (100 mg total) by mouth 3 (three) times daily as needed for cough. 09/14/14   Bayard BeaverEdward M Saguier, PA-C  Calcium Carbonate-Vitamin D (CALCIUM 600 + D PO) Take 2 tablets by mouth daily.    Historical Provider, MD  Cholecalciferol (VITAMIN D) 2000 UNITS tablet Take 2,000 Units by mouth daily.    Historical Provider, MD  Cyanocobalamin (VITAMIN B-12 PO) Take 200 mcg by mouth daily.    Historical Provider, MD  Boris LownKrill Oil 300 MG CAPS Take 1 capsule by mouth daily.    Historical Provider, MD  Multiple Vitamin (MULTIVITAMIN) tablet Take 1 tablet by mouth daily.      Historical Provider, MD   BP 143/79 mmHg  Pulse 70  Temp(Src) 98 F (36.7 C) (Oral)  Resp 16  SpO2 98% Physical Exam  Constitutional: She is oriented to person, place, and time. She appears well-developed and well-nourished. No distress.  HENT:  Head: Normocephalic.  Eyes: Conjunctivae and EOM are normal.  Cardiovascular: Normal rate.   Pulmonary/Chest: Effort normal. No stridor.  Musculoskeletal: Normal range of motion. She exhibits tenderness.       Hands: Diffusely tender to palpation along the dorsum of the left wrists, no focal snuffbox tenderness, full range of motion to  wrist and fingers in flexion and extension the wrist has excellent radial and ulnar deviation. She is distally neurovascularly intact.  Neurological: She is alert and oriented to person, place, and time.  Psychiatric: She has a normal mood and affect.  Nursing note and vitals reviewed.   ED Course  Procedures (including critical care time) Labs Review Labs Reviewed - No data to display  Imaging Review Dg Wrist Complete Left  10/28/2014   CLINICAL DATA:  Crush injury left wrist in a storm door 2 weeks ago. Continued pain. Initial encounter.  EXAM: LEFT WRIST - COMPLETE 3+ VIEW  COMPARISON:  None.  FINDINGS: Imaged bones, joints and soft tissues appear normal.  IMPRESSION: Negative exam.   Electronically Signed   By: Drusilla Kanner  M.D.   On: 10/28/2014 16:51     EKG Interpretation None      MDM   Final diagnoses:  Left wrist sprain, initial encounter    Filed Vitals:   10/28/14 1632 10/28/14 1750  BP: 143/79 141/75  Pulse: 70 74  Temp: 98 F (36.7 C)   TempSrc: Oral   Resp: 16   SpO2: 98% 98%    Medications  ibuprofen (ADVIL,MOTRIN) tablet 400 mg (400 mg Oral Given 10/28/14 1747)    Kristen Huffman is a pleasant 69 y.o. female presenting with persistent pain to left wrist after patient slammed the wrist in a metal storm door 2 weeks ago. No overlying skin changes, physical exam with diffuse tenderness to palpation along the dorsum however she has excellent range of motion. Patient will be placed in a wrist splint have asked her to follow closely with her primary care for follow-up, recommended NSAIDs for anti-inflammatory effects.  Evaluation does not show pathology that would require ongoing emergent intervention or inpatient treatment. Pt is hemodynamically stable and mentating appropriately. Discussed findings and plan with patient/guardian, who agrees with care plan. All questions answered. Return precautions discussed and outpatient follow up given.     Wynetta Emery, PA-C 10/28/14 1753  Candyce Churn III, MD 10/28/14 [phone]

## 2014-10-28 NOTE — ED Notes (Signed)
Patient states her left hand and arm were slammed into a metal door. Hand and arm painful and swollen

## 2014-11-26 ENCOUNTER — Telehealth: Payer: Self-pay | Admitting: Family Medicine

## 2014-11-26 ENCOUNTER — Other Ambulatory Visit (INDEPENDENT_AMBULATORY_CARE_PROVIDER_SITE_OTHER): Payer: Medicare Other

## 2014-11-26 DIAGNOSIS — E785 Hyperlipidemia, unspecified: Secondary | ICD-10-CM

## 2014-11-26 DIAGNOSIS — M25532 Pain in left wrist: Secondary | ICD-10-CM

## 2014-11-26 LAB — LIPID PANEL
CHOLESTEROL: 188 mg/dL (ref 0–200)
HDL: 43.5 mg/dL (ref >39.00)
LDL CALC: 114 mg/dL — AB (ref 0–99)
NonHDL: 144.5
TRIGLYCERIDES: 155 mg/dL — AB (ref 0.0–149.0)
Total CHOL/HDL Ratio: 4
VLDL: 31 mg/dL (ref 0.0–40.0)

## 2014-11-26 NOTE — Telephone Encounter (Signed)
Caller name: Shellee MiloHill, Ethie Relation to pt: self  Call back number: [phone]  Reason for call:  Pt requesting a referral to [facility] for pain in left wrist. Pt was seen in  ER 10/28/14. Please advise

## 2014-11-26 NOTE — Telephone Encounter (Signed)
Ok for referral?

## 2014-11-26 NOTE — Telephone Encounter (Signed)
Referral placed.

## 2014-11-26 NOTE — Telephone Encounter (Signed)
Ok for referral? Pt has not seen you since 05/28/14

## 2014-11-28 ENCOUNTER — Encounter: Payer: Self-pay | Admitting: General Practice

## 2015-01-27 DIAGNOSIS — M65932 Unspecified synovitis and tenosynovitis, left forearm: Secondary | ICD-10-CM

## 2015-01-27 DIAGNOSIS — M65832 Other synovitis and tenosynovitis, left forearm: Secondary | ICD-10-CM

## 2015-01-27 HISTORY — DX: Unspecified synovitis and tenosynovitis, left forearm: M65.932

## 2015-01-27 HISTORY — DX: Other synovitis and tenosynovitis, left forearm: M65.832

## 2015-02-01 ENCOUNTER — Other Ambulatory Visit: Payer: Self-pay | Admitting: General Practice

## 2015-02-01 MED ORDER — ATORVASTATIN CALCIUM 10 MG PO TABS: ORAL_TABLET | ORAL | Status: DC

## 2015-02-07 ENCOUNTER — Telehealth: Payer: Self-pay | Admitting: Family Medicine

## 2015-02-07 NOTE — Telephone Encounter (Signed)
6 months from August would have been Feb.  Pt is overdue for her 6 month check up (August is her physical) Pt will need to discuss her sleep patterns w/ me at her cholesterol f/u so that her insurance will approve a referral for a sleep study.

## 2015-02-07 NOTE — Telephone Encounter (Signed)
Pt is due for follow up now. Needs an appointment as soon as she can come in. We can discuss the sleep study information then.

## 2015-02-07 NOTE — Telephone Encounter (Signed)
Last OV was August for a CPE, pt advised to come back in 6 mos for a BP and cholesterol check (no appt ever made)  Please advise on the Sleep study?

## 2015-02-07 NOTE — Telephone Encounter (Signed)
Caller name:Arrayah Sedita Relationship to patient:Self Can be [phone]   Reason for call: Pt calling in reference to cholesterol check - per last rx lipitor needing to sched appt, PT confused on why, and would like to speak with Shanda BumpsJessica regarding. Offered to schedule a follow up with Dr. Beverely Lowabori pt declined until speaking with Shanda BumpsJessica- states usually only has checked 2x a year- thought her check was in august.   Would also like to speak with Dr. Beverely Lowabori regarding her sleep patterns- wanting to be referred for a sleep study. Asked PT if she ever spoke with Dr. Beverely Lowabori regarding sleep issues- stated that she had not. Informed PT that Dr. Beverely Lowabori may request to see her for follow up or OV.

## 2015-02-08 ENCOUNTER — Ambulatory Visit (INDEPENDENT_AMBULATORY_CARE_PROVIDER_SITE_OTHER): Payer: Medicare Other | Admitting: Family Medicine

## 2015-02-08 ENCOUNTER — Encounter: Payer: Self-pay | Admitting: Family Medicine

## 2015-02-08 VITALS — BP 120/80 | HR 64 | Temp 98.1°F | Resp 16 | Wt 160.1 lb

## 2015-02-08 DIAGNOSIS — G4730 Sleep apnea, unspecified: Secondary | ICD-10-CM | POA: Diagnosis not present

## 2015-02-08 MED ORDER — ATORVASTATIN CALCIUM 10 MG PO TABS: ORAL_TABLET | ORAL | Status: DC

## 2015-02-08 NOTE — Progress Notes (Signed)
Pre visit review using our clinic review tool, if applicable. No additional management support is needed unless otherwise documented below in the visit note. 

## 2015-02-08 NOTE — Progress Notes (Signed)
   Subjective:    Patient ID: Kristen Huffman, female    DOB: 12/29/1945, 69 y.o.   MRN: [id number]  HPI Sleep disorder- 'it's been going on for a good while'.  Wears a fit bit to track sleeping and it shows that she is restless.  Granddaughter recently reported pt had loud snoring.  Not feeling rested when she wakes.  No known breathing pauses.  Husband has OSA.     Review of Systems For ROS see HPI     Objective:   Physical Exam  Constitutional: She is oriented to person, place, and time. She appears well-developed and well-nourished. No distress.  HENT:  Head: Normocephalic and atraumatic.  Mouth/Throat: Oropharynx is clear and moist.  Neck: Normal range of motion. Neck supple. No thyromegaly present.  Musculoskeletal: She exhibits no edema.  Lymphadenopathy:    She has no cervical adenopathy.  Neurological: She is alert and oriented to person, place, and time. No cranial nerve deficit. Coordination normal.  Skin: Skin is warm and dry.  Psychiatric: She has a normal mood and affect. Her behavior is normal. Thought content normal.  Vitals reviewed.         Assessment & Plan:

## 2015-02-08 NOTE — Patient Instructions (Signed)
Schedule your complete physical after 8/31 We'll call you with your pulmonary appt to assess the snoring Keep up the good work!  You look great! Call with any questions or concerns Have a great weekend!!!

## 2015-02-08 NOTE — Assessment & Plan Note (Signed)
New.  Pt has loud snoring and restless sleep.  + daytime sleepiness.  Concern for possible sleep apnea.  Refer to pulmonary for possible sleep study.  Pt expressed understanding and is in agreement w/ plan.

## 2015-02-21 ENCOUNTER — Encounter (HOSPITAL_BASED_OUTPATIENT_CLINIC_OR_DEPARTMENT_OTHER): Payer: Self-pay | Admitting: *Deleted

## 2015-02-22 ENCOUNTER — Ambulatory Visit (HOSPITAL_BASED_OUTPATIENT_CLINIC_OR_DEPARTMENT_OTHER)
Admission: RE | Admit: 2015-02-22 | Discharge: 2015-02-22 | Disposition: A | Discharge status: Home / Self Care | Payer: Medicare Other | Source: Ambulatory Visit | Attending: Orthopedic Surgery | Admitting: Orthopedic Surgery

## 2015-02-22 ENCOUNTER — Ambulatory Visit (HOSPITAL_BASED_OUTPATIENT_CLINIC_OR_DEPARTMENT_OTHER): Payer: Medicare Other | Admitting: Certified Registered"

## 2015-02-22 ENCOUNTER — Encounter (HOSPITAL_BASED_OUTPATIENT_CLINIC_OR_DEPARTMENT_OTHER): Payer: Self-pay

## 2015-02-22 ENCOUNTER — Encounter (HOSPITAL_BASED_OUTPATIENT_CLINIC_OR_DEPARTMENT_OTHER)
Admission: RE | Disposition: A | Discharge status: Home / Self Care | Payer: Self-pay | Source: Ambulatory Visit | Attending: Orthopedic Surgery

## 2015-02-22 DIAGNOSIS — G4730 Sleep apnea, unspecified: Secondary | ICD-10-CM | POA: Insufficient documentation

## 2015-02-22 DIAGNOSIS — E785 Hyperlipidemia, unspecified: Secondary | ICD-10-CM | POA: Insufficient documentation

## 2015-02-22 DIAGNOSIS — Z87891 Personal history of nicotine dependence: Secondary | ICD-10-CM | POA: Diagnosis not present

## 2015-02-22 DIAGNOSIS — G8929 Other chronic pain: Secondary | ICD-10-CM | POA: Insufficient documentation

## 2015-02-22 DIAGNOSIS — M6588 Other synovitis and tenosynovitis, other site: Secondary | ICD-10-CM | POA: Diagnosis not present

## 2015-02-22 HISTORY — DX: Dental restoration status: Z98.811

## 2015-02-22 HISTORY — DX: Other synovitis and tenosynovitis, left forearm: M65.832

## 2015-02-22 HISTORY — DX: Personal history of other infectious and parasitic diseases: Z86.19

## 2015-02-22 HISTORY — PX: DORSAL COMPARTMENT RELEASE: SHX5039

## 2015-02-22 LAB — POCT HEMOGLOBIN-HEMACUE: HEMOGLOBIN: 13.6 g/dL (ref 12.0–15.0)

## 2015-02-22 SURGERY — RELEASE, FIRST DORSAL COMPARTMENT, HAND
Anesthesia: General | Site: Hand | Laterality: Left

## 2015-02-22 MED ORDER — FENTANYL CITRATE (PF) 100 MCG/2ML IJ SOLN
25.0000 ug | INTRAMUSCULAR | Status: DC | PRN
  Administered 2015-02-22: 50 ug via INTRAVENOUS

## 2015-02-22 MED ORDER — BUPIVACAINE HCL (PF) 0.25 % IJ SOLN
INTRAMUSCULAR | Status: DC | PRN
  Administered 2015-02-22: 20 mL

## 2015-02-22 MED ORDER — MIDAZOLAM HCL 2 MG/2ML IJ SOLN
INTRAMUSCULAR | Status: AC
  Filled 2015-02-22: qty 2

## 2015-02-22 MED ORDER — ONDANSETRON HCL 4 MG/2ML IJ SOLN: 4.0000 mg | Freq: Four times a day (QID) | INTRAMUSCULAR | Status: DC | PRN

## 2015-02-22 MED ORDER — PROPOFOL 10 MG/ML IV BOLUS
INTRAVENOUS | Status: DC | PRN
  Administered 2015-02-22: 150 mg via INTRAVENOUS

## 2015-02-22 MED ORDER — FENTANYL CITRATE (PF) 100 MCG/2ML IJ SOLN
INTRAMUSCULAR | Status: AC
  Filled 2015-02-22: qty 4

## 2015-02-22 MED ORDER — LIDOCAINE HCL (CARDIAC) 20 MG/ML IV SOLN
INTRAVENOUS | Status: DC | PRN
  Administered 2015-02-22: 60 mg via INTRAVENOUS

## 2015-02-22 MED ORDER — DEXAMETHASONE SODIUM PHOSPHATE 10 MG/ML IJ SOLN
INTRAMUSCULAR | Status: DC | PRN
  Administered 2015-02-22: 10 mg via INTRAVENOUS

## 2015-02-22 MED ORDER — FENTANYL CITRATE (PF) 100 MCG/2ML IJ SOLN
INTRAMUSCULAR | Status: DC | PRN
  Administered 2015-02-22: 25 ug via INTRAVENOUS
  Administered 2015-02-22: 50 ug via INTRAVENOUS
  Administered 2015-02-22: 25 ug via INTRAVENOUS

## 2015-02-22 MED ORDER — OXYCODONE HCL 5 MG/5ML PO SOLN: 5.0000 mg | Freq: Once | ORAL | Status: DC | PRN

## 2015-02-22 MED ORDER — FENTANYL CITRATE (PF) 100 MCG/2ML IJ SOLN
INTRAMUSCULAR | Status: AC
  Filled 2015-02-22: qty 2

## 2015-02-22 MED ORDER — LACTATED RINGERS IV SOLN
INTRAVENOUS | Status: DC
  Administered 2015-02-22 (×2): via INTRAVENOUS

## 2015-02-22 MED ORDER — CEFAZOLIN SODIUM-DEXTROSE 2-3 GM-% IV SOLR
INTRAVENOUS | Status: DC | PRN
  Administered 2015-02-22: 2 g via INTRAVENOUS

## 2015-02-22 MED ORDER — MIDAZOLAM HCL 5 MG/5ML IJ SOLN
INTRAMUSCULAR | Status: DC | PRN
  Administered 2015-02-22: 0.5 mg via INTRAVENOUS

## 2015-02-22 MED ORDER — TRAMADOL HCL 50 MG PO TABS: 100.0000 mg | ORAL_TABLET | Freq: Four times a day (QID) | ORAL | Status: DC | PRN

## 2015-02-22 MED ORDER — ONDANSETRON HCL 4 MG/2ML IJ SOLN
INTRAMUSCULAR | Status: DC | PRN
Start: 2015-02-22 — End: 2015-02-22
  Administered 2015-02-22: 4 mg via INTRAVENOUS

## 2015-02-22 MED ORDER — OXYCODONE HCL 5 MG PO TABS: 5.0000 mg | ORAL_TABLET | Freq: Once | ORAL | Status: DC | PRN

## 2015-02-22 SURGICAL SUPPLY — 51 items
BANDAGE ELASTIC 3 VELCRO ST LF (GAUZE/BANDAGES/DRESSINGS) ×6 IMPLANT
BENZOIN TINCTURE PRP APPL 2/3 (GAUZE/BANDAGES/DRESSINGS) ×3 IMPLANT
BLADE SURG 15 STRL LF DISP TIS (BLADE) ×1 IMPLANT
BLADE SURG 15 STRL SS (BLADE) ×2
BNDG CONFORM 3 STRL LF (GAUZE/BANDAGES/DRESSINGS) ×3 IMPLANT
BRUSH SCRUB EZ PLAIN DRY (MISCELLANEOUS) ×3 IMPLANT
CLOSURE WOUND 1/2 X4 (GAUZE/BANDAGES/DRESSINGS) ×1
CORDS BIPOLAR (ELECTRODE) IMPLANT
COVER BACK TABLE 60X90IN (DRAPES) ×3 IMPLANT
COVER MAYO STAND STRL (DRAPES) ×3 IMPLANT
CUFF TOURNIQUET SINGLE 18IN (TOURNIQUET CUFF) ×3 IMPLANT
DRAPE EXTREMITY T 121X128X90 (DRAPE) ×3 IMPLANT
DRAPE SURG 17X23 STRL (DRAPES) ×3 IMPLANT
DRSG EMULSION OIL 3X3 NADH (GAUZE/BANDAGES/DRESSINGS) ×3 IMPLANT
GAUZE SPONGE 4X4 12PLY STRL (GAUZE/BANDAGES/DRESSINGS) ×3 IMPLANT
GAUZE SPONGE 4X4 16PLY XRAY LF (GAUZE/BANDAGES/DRESSINGS) IMPLANT
GLOVE BIO SURGEON STRL SZ 6.5 (GLOVE) ×2 IMPLANT
GLOVE BIO SURGEONS STRL SZ 6.5 (GLOVE) ×1
GLOVE BIOGEL M STRL SZ7.5 (GLOVE) IMPLANT
GLOVE BIOGEL PI IND STRL 7.0 (GLOVE) ×1 IMPLANT
GLOVE BIOGEL PI INDICATOR 7.0 (GLOVE) ×2
GLOVE SS BIOGEL STRL SZ 8 (GLOVE) ×1 IMPLANT
GLOVE SUPERSENSE BIOGEL SZ 8 (GLOVE) ×2
GOWN STRL REUS W/ TWL LRG LVL3 (GOWN DISPOSABLE) ×1 IMPLANT
GOWN STRL REUS W/ TWL XL LVL3 (GOWN DISPOSABLE) ×1 IMPLANT
GOWN STRL REUS W/TWL LRG LVL3 (GOWN DISPOSABLE) ×2
GOWN STRL REUS W/TWL XL LVL3 (GOWN DISPOSABLE) ×2
NDL SAFETY ECLIPSE 18X1.5 (NEEDLE) IMPLANT
NEEDLE HYPO 18GX1.5 SHARP (NEEDLE)
NEEDLE HYPO 25X1 1.5 SAFETY (NEEDLE) ×3 IMPLANT
NS IRRIG 1000ML POUR BTL (IV SOLUTION) ×3 IMPLANT
PACK BASIN DAY SURGERY FS (CUSTOM PROCEDURE TRAY) ×3 IMPLANT
PAD CAST 3X4 CTTN HI CHSV (CAST SUPPLIES) ×1 IMPLANT
PADDING CAST ABS 3INX4YD NS (CAST SUPPLIES)
PADDING CAST ABS 4INX4YD NS (CAST SUPPLIES) ×2
PADDING CAST ABS COTTON 3X4 (CAST SUPPLIES) IMPLANT
PADDING CAST ABS COTTON 4X4 ST (CAST SUPPLIES) ×1 IMPLANT
PADDING CAST COTTON 3X4 STRL (CAST SUPPLIES) ×2
SPLINT PLASTER CAST XFAST 3X15 (CAST SUPPLIES) IMPLANT
SPLINT PLASTER XTRA FASTSET 3X (CAST SUPPLIES)
STOCKINETTE 4X48 STRL (DRAPES) ×3 IMPLANT
STOCKINETTE TUBULAR COTT 4X25 (GAUZE/BANDAGES/DRESSINGS) IMPLANT
STOCKINETTE TUBULAR COTTON 2IN (GAUZE/BANDAGES/DRESSINGS) IMPLANT
STRIP CLOSURE SKIN 1/2X4 (GAUZE/BANDAGES/DRESSINGS) ×2 IMPLANT
SUT PROLENE 4 0 PS 2 18 (SUTURE) ×3 IMPLANT
SYR BULB 3OZ (MISCELLANEOUS) ×3 IMPLANT
SYR CONTROL 10ML LL (SYRINGE) ×3 IMPLANT
TOWEL OR 17X24 6PK STRL BLUE (TOWEL DISPOSABLE) ×3 IMPLANT
TOWEL OR NON WOVEN STRL DISP B (DISPOSABLE) ×3 IMPLANT
TRAY DSU PREP LF (CUSTOM PROCEDURE TRAY) ×3 IMPLANT
UNDERPAD 30X30 (UNDERPADS AND DIAPERS) ×3 IMPLANT

## 2015-02-22 NOTE — Transfer of Care (Signed)
Immediate Anesthesia Transfer of Care Note  Patient: Kristen Huffman  Procedure(s) Performed: Procedure(s): RELEASE LEFT FIRST DORSAL COMPARTMENT (DEQUERVAIN) AND RADIAL TENOSYNOVECTOMY (Left)  Patient Location: PACU  Anesthesia Type:General  Level of Consciousness: awake, alert , oriented and patient cooperative  Airway & Oxygen Therapy: Patient Spontanous Breathing and Patient connected to face mask oxygen  Post-op Assessment: Report given to RN and Post -op Vital signs reviewed and stable  Post vital signs: Reviewed and stable  Last Vitals:  Filed Vitals:   02/22/15 1258  BP: 148/83  Pulse: 64  Temp: 36.5 C  Resp: 16    Complications: No apparent anesthesia complications

## 2015-02-22 NOTE — H&P (Signed)
Kristen Huffman is an 69 y.o. female.   Chief Complaint: present for left extensor tenosynovectomy at the wrist HPI: Patient presents for evaluation and treatment of the of their upper extremity predicament. The patient denies neck, back, chest or  abdominal pain. The patient notes that they have no lower extremity problems. The patients primary complaint is noted. We are planning surgical care pathway for the upper extremity.  Past Medical History  Diagnosis Date  . Hyperlipidemia   . History of hepatitis A   . Extensor tenosynovitis of left wrist 01/2015  . Dental crown present     Past Surgical History  Procedure Laterality Date  . Incontinence surgery    . Knee arthroscopy w/ meniscal repair Right   . Repair ankle ligament Left   . Eye foreign body removal      Family History  Problem Relation Age of Onset  . Heart attack Father 3550  . Hypertension Mother   . Heart failure Mother    Social History:  reports that she quit smoking about 35 years ago. She does not have any smokeless tobacco history on file. She reports that she does not drink alcohol or use illicit drugs.  Allergies:  Allergies  Allergen Reactions  . Codeine Other (See Comments)    UNKNOWN - WAS AS A CHILD  . Novocain [Procaine] Itching, Swelling and Rash  . Penicillins Rash    Medications Prior to Admission  Medication Sig Dispense Refill  . Ascorbic Acid (VITAMIN C PO) Take 600 Units by mouth daily.      Marland Kitchen. atorvastatin (LIPITOR) 10 MG tablet TAKE 1 TABLET EVERY DAY 90 tablet 1  . Calcium Carbonate (CALCIUM 600 PO) Take by mouth.    . cetirizine (ZYRTEC) 10 MG tablet Take 10 mg by mouth daily.    . Cholecalciferol (VITAMIN D) 2000 UNITS tablet Take 2,000 Units by mouth daily.    . Cyanocobalamin (VITAMIN B-12 PO) Take 200 mcg by mouth daily.    Boris Lown. Krill Oil 300 MG CAPS Take 1 capsule by mouth daily.    . Multiple Vitamin (MULTIVITAMIN) tablet Take 1 tablet by mouth daily.      . Multiple Vitamins-Minerals  (HAIR VITAMINS PO) Take by mouth.    . Probiotic Product (PROBIOTIC DAILY PO) Take by mouth.      Results for orders placed or performed during the hospital encounter of 02/22/15 (from the past 48 hour(s))  Hemoglobin-hemacue, POC     Status: None   Collection Time: 02/22/15  1:48 PM  Result Value Ref Range   Hemoglobin 13.6 12.0 - 15.0 g/dL   No results found.  Review of Systems  Respiratory: Negative.   Genitourinary: Negative.   Neurological: Negative.   Psychiatric/Behavioral: Negative.     Blood pressure 148/83, pulse 64, temperature 97.7 F (36.5 C), temperature source Oral, resp. rate 16, height 5\' 6"  (1.676 m), weight 73.993 kg (163 lb 2 oz), SpO2 99 %. Physical Exam left wrist chronic tenosynovitis   The patient is alert and oriented in no acute distress. The patient complains of pain in the affected upper extremity.  The patient is noted to have a normal HEENT exam. Lung fields show equal chest expansion and no shortness of breath. Abdomen exam is nontender without distention. Lower extremity examination does not show any fracture dislocation or blood clot symptoms. Pelvis is stable and the neck and back are stable and nontender.  Assessment/Plan Plan L wrist tenosynovectomy  We are planning surgery for your upper  extremity. The risk and benefits of surgery to include risk of bleeding, infection, anesthesia,  damage to normal structures and failure of the surgery to accomplish its intended goals of relieving symptoms and restoring function have been discussed in detail. With this in mind we plan to proceed. I have specifically discussed with the patient the pre-and postoperative regime and the dos and don'ts and risk and benefits in great detail. Risk and benefits of surgery also include risk of dystrophy(CRPS), chronic nerve pain, failure of the healing process to go onto completion and other inherent risks of surgery The relavent the pathophysiology of the  disease/injury process, as well as the alternatives for treatment and postoperative course of action has been discussed in great detail with the patient who desires to proceed.  We will do everything in our power to help you (the patient) restore function to the upper extremity. It is a pleasure to see this patient today.   Karen Chafe 02/22/2015, 2:37 PM

## 2015-02-22 NOTE — Anesthesia Procedure Notes (Signed)
Procedure Name: LMA Insertion Date/Time: 02/22/2015 2:38 PM Performed by: Kimmberly Wisser D Pre-anesthesia Checklist: Patient identified, Emergency Drugs available, Suction available and Patient being monitored Patient Re-evaluated:Patient Re-evaluated prior to inductionOxygen Delivery Method: Circle System Utilized Preoxygenation: Pre-oxygenation with 100% oxygen Intubation Type: IV induction Ventilation: Mask ventilation without difficulty LMA: LMA inserted LMA Size: 4.0 Number of attempts: 1 Airway Equipment and Method: Bite block Placement Confirmation: positive ETCO2 Tube secured with: Tape Dental Injury: Teeth and Oropharynx as per pre-operative assessment

## 2015-02-22 NOTE — Anesthesia Postprocedure Evaluation (Signed)
Anesthesia Post Note  Patient: Kristen Huffman  Procedure(s) Performed: Procedure(s) (LRB): RELEASE LEFT FIRST DORSAL COMPARTMENT (DEQUERVAIN) AND RADIAL TENOSYNOVECTOMY (Left)  Anesthesia type: General  Patient location: PACU  Post pain: Pain level controlled and Adequate analgesia  Post assessment: Post-op Vital signs reviewed, Patient's Cardiovascular Status Stable, Respiratory Function Stable, Patent Airway and Pain level controlled  Last Vitals:  Filed Vitals:   02/22/15 1615  BP: 143/71  Pulse: 53  Temp:   Resp: 10    Post vital signs: Reviewed and stable  Level of consciousness: awake, alert  and oriented  Complications: No apparent anesthesia complications

## 2015-02-22 NOTE — Discharge Instructions (Signed)
Keep bandage clean and dry.  Call for any problems.  No smoking.  Criteria for driving a car: you should be off your pain medicine for 7-8 hours, able to drive one handed(confident), thinking clearly and feeling able in your judgement to drive. °Continue elevation as it will decrease swelling.  If instructed by MD move your fingers within the confines of the bandage/splint.  Use ice if instructed by your MD. Call immediately for any sudden loss of feeling in your hand/arm or change in functional abilities of the extremity.We recommend that you to take vitamin C 1000 mg a day to promote healing. °We also recommend that if you require  pain medicine that you take a stool softener to prevent constipation as most pain medicines will have constipation side effects. We recommend either Peri-Colace or Senokot and recommend that you also consider adding MiraLAX to prevent the constipation affects from pain medicine if you are required to use them. These medicines are over the counter and maybe purchased at a local pharmacy. A cup of yogurt and a probiotic can also be helpful during the recovery process as the medicines can disrupt your intestinal environment. ° ° °Post Anesthesia Home Care Instructions ° °Activity: °Get plenty of rest for the remainder of the day. A responsible adult should stay with you for 24 hours following the procedure.  °For the next 24 hours, DO NOT: °-Drive a car °-Operate machinery °-Drink alcoholic beverages °-Take any medication unless instructed by your physician °-Make any legal decisions or sign important papers. ° °Meals: °Start with liquid foods such as gelatin or soup. Progress to regular foods as tolerated. Avoid greasy, spicy, heavy foods. If nausea and/or vomiting occur, drink only clear liquids until the nausea and/or vomiting subsides. Call your physician if vomiting continues. ° °Special Instructions/Symptoms: °Your throat may feel dry or sore from the anesthesia or the breathing tube  placed in your throat during surgery. If this causes discomfort, gargle with warm salt water. The discomfort should disappear within 24 hours. ° °If you had a scopolamine patch placed behind your ear for the management of post- operative nausea and/or vomiting: ° °1. The medication in the patch is effective for 72 hours, after which it should be removed.  Wrap patch in a tissue and discard in the trash. Wash hands thoroughly with soap and water. °2. You may remove the patch earlier than 72 hours if you experience unpleasant side effects which may include dry mouth, dizziness or visual disturbances. °3. Avoid touching the patch. Wash your hands with soap and water after contact with the patch. °  ° °

## 2015-02-22 NOTE — Op Note (Signed)
See dictation#[id number] Aiken Withem MD 

## 2015-02-22 NOTE — Anesthesia Preprocedure Evaluation (Addendum)
Anesthesia Evaluation  Patient identified by MRN, date of birth, ID band Patient awake    Reviewed: Allergy & Precautions, NPO status , Patient's Chart, lab work & pertinent test results  Airway Mallampati: II   Neck ROM: full    Dental   Pulmonary sleep apnea , former smoker,  breath sounds clear to auscultation        Cardiovascular negative cardio ROS  Rhythm:regular Rate:Normal     Neuro/Psych    GI/Hepatic GERD-  ,  Endo/Other    Renal/GU      Musculoskeletal   Abdominal   Peds  Hematology   Anesthesia Other Findings   Reproductive/Obstetrics                            Anesthesia Physical Anesthesia Plan  ASA: II  Anesthesia Plan: General   Post-op Pain Management:    Induction: Intravenous  Airway Management Planned: LMA  Additional Equipment:   Intra-op Plan:   Post-operative Plan:   Informed Consent: I have reviewed the patients History and Physical, chart, labs and discussed the procedure including the risks, benefits and alternatives for the proposed anesthesia with the patient or authorized representative who has indicated his/her understanding and acceptance.     Plan Discussed with: CRNA, Anesthesiologist and Surgeon  Anesthesia Plan Comments:        Anesthesia Quick Evaluation

## 2015-02-26 ENCOUNTER — Encounter (HOSPITAL_BASED_OUTPATIENT_CLINIC_OR_DEPARTMENT_OTHER): Payer: Self-pay | Admitting: Orthopedic Surgery

## 2015-02-26 NOTE — Op Note (Signed)
Kristen Huffman, Kristen Huffman                  ACCOUNT NO.:  [id number]  MEDICAL RECORD NO.:  [id number]  LOCATION:                               FACILITY:  [facility]  PHYSICIAN:  Dionne Ano. Dilara Navarrete, M.D.DATE OF BIRTH:  01-14-1946  DATE OF PROCEDURE:  02/22/2015 DATE OF DISCHARGE:  02/22/2015                              OPERATIVE REPORT   PREOPERATIVE DIAGNOSIS:  Extensive tenosynovitis, left first dorsal compartment with loss of motion, chronic pain, and evidence of tendon attenuation.  POSTOPERATIVE DIAGNOSIS:  Extensive tenosynovitis, left first dorsal compartment with loss of motion, chronic pain, and evidence of tendon attenuation.  PROCEDURE: 1. First dorsal compartment release and radical tenosynovectomy about     the extensor apparatus of the EPB and APL tendons. 2. Superficial radial nerve neurolysis.  SURGEON:  Dionne Ano. Amanda Pea, M.D.  ASSISTANT:  None.  COMPLICATIONS:  None.  ANESTHESIA:  General.  TOURNIQUET TIME:  Less than an hour.  DRAINS:  None.  INDICATIONS FOR PROCEDURE:  This patient is a 69 year old female, who presents with an unusual presentation of exuberant tenosynovitis.  She was seen by my partner, and referred for evaluation after workup ensued.  The patient has tearing of the EPB and APL tendons as well as an intratendinous ganglion about the tendon architecture and evidence of chronic stenosing tenosynovitis as well.  Unfortunately, she is quite miserable.  We have forgone any further injections, and she desires to proceed with surgical evaluation and treatment.  OPERATION IN DETAIL:  The patient was seen by myself and Anesthesia. Taken to the operative theater and underwent smooth induction of general anesthetic.  She was prepped and draped in usual sterile fashion with Betadine scrub and paint.  Following this, outline marks were made visually and I performed an oblique incision.  I used a little bit larger incision than usual due to the exuberant  findings preoperatively.  Dissection was carried down and I very carefully and cautiously identified the superficial radial nerve as it split and made sure that we protected this and neurolysis was accomplished as it was encased in inflammatory fluid from the area above and below the first dorsal compartment.  Following the neurolysis, I then performed a first dorsal compartment release followed by an extensive tenosynovectomy of the EPB and APL tendons.  I removed an intratendinous ganglion about the APL.  I also removed split tears in the EPB, but kept the main bulk intact.  We performed a very careful and cautious tenosynovectomy with a combination of scissor and orthopedic instruments.  The patient tolerated this well. Following this, we then very carefully and cautiously demonstrated full range of motion.  The EPB had a tendency to ride out of the first dorsal compartment and this was just fine given the fact that we released the dorsal compartment off the ledge and it set relatively tension-free. Following this, I then very carefully and cautiously performed irrigation followed by securing hemostasis with bipolar electrocautery. The wound was closed with 5-0 Prolene in an interrupted fashion.  There were no complicating features.  The patient was dressed with Adaptic, Xeroform, gauze, and a thumb spica splint.  She was taken to recovery room  stable condition.  We will plan to have her take tramadol at her request for pain, elevate, move, massage.  Ice as well will be helpful.  I have discussed with her these issues at length, do's and don'ts.  I did place 18 mL of Sensorcaine without epinephrine for postop analgesia.  All questions have been encouraged and answered, do's and don'ts discussed.  It was a pleasure to participate in her care today and we look forward to participate in her operative recovery.  I will see her back in 10 days with therapy immediately following.  We will  remove her stitches, apply Steri-Strips, and go ahead and get active range of motion going.     Dionne AnoWilliam M. Amanda PeaGramig, M.D.   ______________________________ Dionne AnoWilliam M. Amanda PeaGramig, M.D.    [facility]  D:  02/22/2015  T:  02/23/2015  Job:  [id number]

## 2015-03-11 ENCOUNTER — Other Ambulatory Visit: Payer: Self-pay | Admitting: General Practice

## 2015-03-11 MED ORDER — ATORVASTATIN CALCIUM 10 MG PO TABS: ORAL_TABLET | ORAL | Status: DC

## 2015-04-17 ENCOUNTER — Ambulatory Visit (INDEPENDENT_AMBULATORY_CARE_PROVIDER_SITE_OTHER): Payer: Medicare Other | Admitting: Pulmonary Disease

## 2015-04-17 ENCOUNTER — Encounter: Payer: Self-pay | Admitting: Pulmonary Disease

## 2015-04-17 VITALS — BP 132/82 | HR 70 | Ht 66.0 in | Wt 162.8 lb

## 2015-04-17 DIAGNOSIS — G4730 Sleep apnea, unspecified: Secondary | ICD-10-CM | POA: Diagnosis not present

## 2015-04-17 NOTE — Progress Notes (Signed)
Subjective:    Patient ID: Kristen Huffman, female    DOB: 11/14/1945, 69 y.o.   MRN: [id number]  HPI  69 year old presents for evaluation of sleep-disordered breathing. She retired earlier this year Epworth sleepiness score is 0 and she denies excessive daytime fatigue. Her grandkids have commented on moderate snoring. Her husband uses a CPAP and has not witnessed apneas. Bedtime is between 10:30 and midnight, sleep latency varies, she does report difficulty in falling asleep and feels she is very restless at bedtime, she reports several awakenings including 1 bathroom visits, sleeps on her back with one pillow, is generally out of bed at 7 AM feeling refreshed without dryness of mouth or headaches. There is no history suggestive of cataplexy, sleep paralysis or parasomnias She is mainly concerned because her fitbit has indicated restless sleep-if total sleep time is 8 hours, it indicates at least 2 hours of awakening and restlessness We reviewed referred by data   Past Medical History  Diagnosis Date  . Hyperlipidemia   . History of hepatitis A   . Extensor tenosynovitis of left wrist 01/2015  . Dental crown present     Past Surgical History  Procedure Laterality Date  . Incontinence surgery    . Knee arthroscopy w/ meniscal repair Right   . Repair ankle ligament Left   . Eye foreign body removal    . Dorsal compartment release Left 02/22/2015    Procedure: RELEASE LEFT FIRST DORSAL COMPARTMENT (DEQUERVAIN) AND RADIAL TENOSYNOVECTOMY;  Surgeon: Dominica SeverinWilliam Gramig, MD;  Location: [facility];  Service: Orthopedics;  Laterality: Left;   Allergies  Allergen Reactions  . Codeine Other (See Comments)    UNKNOWN - WAS AS A CHILD  . Novocain [Procaine] Itching, Swelling and Rash  . Penicillins Rash    History   Social History  . Marital Status: Married    Spouse Name: N/A  . Number of Children: 2  . Years of Education: N/A   Occupational History  . Not on file.    Social History Main Topics  . Smoking status: Former Smoker -- 0.50 packs/day for 15 years    Types: Cigarettes    Quit date: 09/29/1979  . Smokeless tobacco: Not on file  . Alcohol Use: No  . Drug Use: No  . Sexual Activity: Not on file   Other Topics Concern  . Not on file   Social History Narrative    Family History  Problem Relation Age of Onset  . Heart attack Father 1550  . Hypertension Mother   . Heart failure Mother     Review of Systems  Constitutional: Negative for fever, chills and unexpected weight change.  HENT: Negative for congestion, dental problem, ear pain, nosebleeds, postnasal drip, rhinorrhea, sinus pressure, sneezing, sore throat, trouble swallowing and voice change.   Eyes: Negative for visual disturbance.  Respiratory: Negative for choking and shortness of breath.   Cardiovascular: Negative for chest pain and leg swelling.  Gastrointestinal: Negative for vomiting, abdominal pain and diarrhea.  Genitourinary: Negative for difficulty urinating.  Musculoskeletal: Negative for arthralgias.  Skin: Negative for rash.  Neurological: Negative for tremors, syncope and headaches.  Hematological: Does not bruise/bleed easily.       Objective:   Physical Exam  Gen. Pleasant, well-nourished, in no distress, normal affect ENT - no lesions, no post nasal drip Neck: No JVD, no thyromegaly, no carotid bruits Lungs: no use of accessory muscles, no dullness to percussion, clear without rales or rhonchi  Cardiovascular: Rhythm  regular, heart sounds  normal, no murmurs or gallops, no peripheral edema Abdomen: soft and non-tender, no hepatosplenomegaly, BS normal. Musculoskeletal: No deformities, no cyanosis or clubbing Neuro:  alert, non focal       Assessment & Plan:

## 2015-04-17 NOTE — Patient Instructions (Signed)
We reviewed fitbit data I do not think you have obstructive sleep apnea  If snoring or sleepiness worsens, call us & we can do home sleep study

## 2015-04-17 NOTE — Assessment & Plan Note (Signed)
We reviewed fitbit data I do not think you have obstructive sleep apnea  If snoring or sleepiness worsens, call us & we can do home sleep study   The pathophysiology of obstructive sleep apnea , it's cardiovascular consequences & modes of treatment including CPAP were discused with the patient in detail & they evidenced understanding.

## 2015-05-20 ENCOUNTER — Telehealth: Payer: Self-pay | Admitting: Family Medicine

## 2015-05-20 NOTE — Telephone Encounter (Signed)
Pre visit letter mailed 05/20/15 °

## 2015-06-07 ENCOUNTER — Encounter: Payer: Self-pay | Admitting: Behavioral Health

## 2015-06-07 ENCOUNTER — Telehealth: Payer: Self-pay | Admitting: Behavioral Health

## 2015-06-07 NOTE — Telephone Encounter (Signed)
Pre-Visit Call completed with patient and chart updated.   Pre-Visit Info documented in Specialty Comments under [employer].    

## 2015-06-10 ENCOUNTER — Ambulatory Visit (INDEPENDENT_AMBULATORY_CARE_PROVIDER_SITE_OTHER): Payer: Medicare Other | Admitting: Family Medicine

## 2015-06-10 ENCOUNTER — Encounter: Payer: Self-pay | Admitting: Family Medicine

## 2015-06-10 VITALS — BP 122/82 | HR 66 | Temp 98.0°F | Resp 16 | Ht 66.0 in | Wt 162.5 lb

## 2015-06-10 DIAGNOSIS — Z0000 Encounter for general adult medical examination without abnormal findings: Secondary | ICD-10-CM | POA: Diagnosis not present

## 2015-06-10 DIAGNOSIS — E785 Hyperlipidemia, unspecified: Secondary | ICD-10-CM | POA: Diagnosis not present

## 2015-06-10 DIAGNOSIS — Z23 Encounter for immunization: Secondary | ICD-10-CM | POA: Diagnosis not present

## 2015-06-10 NOTE — Assessment & Plan Note (Signed)
Chronic problem.  Tolerating statin w/o difficulty.  Discussed need for healthy diet and regular exercise.  Check labs.  Adjust meds prn  

## 2015-06-10 NOTE — Assessment & Plan Note (Addendum)
Pt's PE WNL.  UTD on mammo (scheduled), GYN, colonoscopy.  Written screening schedule updated and given to pt.  Flu given today.  UTD on remaining vaccines.  Check labs.  Anticipatory guidance provided.

## 2015-06-10 NOTE — Progress Notes (Signed)
Pre visit review using our clinic review tool, if applicable. No additional management support is needed unless otherwise documented below in the visit note. 

## 2015-06-10 NOTE — Progress Notes (Signed)
   Subjective:    Patient ID: Kristen Huffman, female    DOB: [birth year], 69 y.o.   MRN: [id number]  HPI Here today for CPE.  Risk Factors: Hyperlipidemia- chronic problem, on Lipitor daily.  Denies abd pain, N/V, myalgias Physical Activity: exercising regularly Fall Risk: low Depression: denies Hearing: normal to conversational tones and whispered voice at 6 ft ADL's: independent Cognitive: normal linear thought process Home Safety: safe at home Height, Weight, BMI, Visual Acuity: see vitals, vision corrected to 20/20 w/ glasses Counseling: Has mammo scheduled for later this month, UTD on GYN.  UTD on colonoscopy Elnoria Howard, due 2025), due for DEXA- plans to schedule w/ GYN Care team reviewed and updated w/ pt Labs Ordered: See A&P Care Plan: See A&P    Review of Systems Patient reports no vision/ hearing changes, adenopathy,fever, weight change,  persistant/recurrent hoarseness , swallowing issues, chest pain, palpitations, edema, persistant/recurrent cough, hemoptysis, dyspnea (rest/exertional/paroxysmal nocturnal), gastrointestinal bleeding (melena, rectal bleeding), abdominal pain, significant heartburn, bowel changes, GU symptoms (dysuria, hematuria, incontinence), Gyn symptoms (abnormal  bleeding, pain),  syncope, focal weakness, memory loss, numbness & tingling, skin/hair/nail changes, abnormal bruising or bleeding, anxiety, or depression.     Objective:   Physical Exam General Appearance:    Alert, cooperative, no distress, appears stated age  Head:    Normocephalic, without obvious abnormality, atraumatic  Eyes:    PERRL, conjunctiva/corneas clear, EOM's intact, fundi    benign, both eyes  Ears:    Normal TM's and external ear canals, both ears  Nose:   Nares normal, septum midline, mucosa normal, no drainage    or sinus tenderness  Throat:   Lips, mucosa, and tongue normal; teeth and gums normal  Neck:   Supple, symmetrical, trachea midline, no adenopathy;    Thyroid: no  enlargement/tenderness/nodules  Back:     Symmetric, no curvature, ROM normal, no CVA tenderness  Lungs:     Clear to auscultation bilaterally, respirations unlabored  Chest Wall:    No tenderness or deformity   Heart:    Regular rate and rhythm, S1 and S2 normal, no murmur, rub   or gallop  Breast Exam:    Deferred to GYN  Abdomen:     Soft, non-tender, bowel sounds active all four quadrants,    no masses, no organomegaly  Genitalia:    Deferred to GYN  Rectal:    Extremities:   Extremities normal, atraumatic, no cyanosis or edema  Pulses:   2+ and symmetric all extremities  Skin:   Skin color, texture, turgor normal, no rashes or lesions  Lymph nodes:   Cervical, supraclavicular, and axillary nodes normal  Neurologic:   CNII-XII intact, normal strength, sensation and reflexes    throughout          Assessment & Plan:

## 2015-06-10 NOTE — Patient Instructions (Signed)
Follow up in 6 months to recheck cholesterol We'll notify you of your lab results and make any changes if needed Keep up the good work on healthy diet and regular exercise- you look great! Ask your GYN about the DEXA (bone density)- if they don't do one, call and let me know You are up to date on colonoscopy until 2025- yay! Call with any questions or concerns If you want to join [place] at the new [place] office, any scheduled appointments will automatically transfer and we will see you at [address] [place] [address] [place], [place] [address]  Have a great fall and Happy Early [place] Ouch!!!

## 2015-06-20 ENCOUNTER — Encounter: Payer: Medicare Other | Admitting: Family Medicine

## 2015-06-24 ENCOUNTER — Encounter: Payer: Self-pay | Admitting: General Practice

## 2015-06-24 NOTE — Progress Notes (Signed)
Sent pt a mychart message to schedule a lab appt.

## 2015-07-02 ENCOUNTER — Other Ambulatory Visit: Payer: Medicare Other

## 2015-07-02 LAB — LIPID PANEL
CHOL/HDL RATIO: 4
Cholesterol: 166 mg/dL (ref 0–200)
HDL: 43.4 mg/dL (ref >39.00)
LDL Cholesterol: 101 mg/dL — ABNORMAL HIGH (ref 0–99)
NonHDL: 123.03
Triglycerides: 109 mg/dL (ref 0.0–149.0)
VLDL: 21.8 mg/dL (ref 0.0–40.0)

## 2015-07-02 LAB — CBC WITH DIFFERENTIAL/PLATELET
BASOS PCT: 0.8 % (ref 0.0–3.0)
Basophils Absolute: 0 10*3/uL (ref 0.0–0.1)
Eosinophils Absolute: 0.1 10*3/uL (ref 0.0–0.7)
Eosinophils Relative: 2.7 % (ref 0.0–5.0)
HEMATOCRIT: 38.9 % (ref 36.0–46.0)
Hemoglobin: 12.9 g/dL (ref 12.0–15.0)
Lymphocytes Relative: 43.8 % (ref 12.0–46.0)
Lymphs Abs: 2.2 10*3/uL (ref 0.7–4.0)
MCHC: 33.3 g/dL (ref 30.0–36.0)
MCV: 85.4 fl (ref 78.0–100.0)
MONO ABS: 0.5 10*3/uL (ref 0.1–1.0)
Monocytes Relative: 8.9 % (ref 3.0–12.0)
NEUTROS ABS: 2.2 10*3/uL (ref 1.4–7.7)
Neutrophils Relative %: 43.8 % (ref 43.0–77.0)
PLATELETS: 181 10*3/uL (ref 150.0–400.0)
RBC: 4.55 Mil/uL (ref 3.87–5.11)
RDW: 13.8 % (ref 11.5–15.5)
WBC: 5.1 10*3/uL (ref 4.0–10.5)

## 2015-07-02 LAB — BASIC METABOLIC PANEL
BUN: 15 mg/dL (ref 6–23)
CO2: 28 mEq/L (ref 19–32)
Calcium: 9.6 mg/dL (ref 8.4–10.5)
Chloride: 108 mEq/L (ref 96–112)
Creatinine, Ser: 0.72 mg/dL (ref 0.40–1.20)
GFR: 85.87 mL/min (ref >60.00)
Glucose, Bld: 84 mg/dL (ref 70–99)
POTASSIUM: 4.1 meq/L (ref 3.5–5.1)
SODIUM: 142 meq/L (ref 135–145)

## 2015-07-02 LAB — HEPATIC FUNCTION PANEL
ALK PHOS: 62 U/L (ref 39–117)
ALT: 16 U/L (ref 0–35)
AST: 20 U/L (ref 0–37)
Albumin: 4.2 g/dL (ref 3.5–5.2)
BILIRUBIN DIRECT: 0.1 mg/dL (ref 0.0–0.3)
Total Bilirubin: 0.5 mg/dL (ref 0.2–1.2)
Total Protein: 6.9 g/dL (ref 6.0–8.3)

## 2015-07-02 LAB — TSH: TSH: 2.3 u[IU]/mL (ref 0.35–4.50)

## 2015-08-14 LAB — FECAL OCCULT BLOOD, GUAIAC: FECAL OCCULT BLD: NEGATIVE

## 2015-10-21 ENCOUNTER — Encounter: Payer: Self-pay | Admitting: Family Medicine

## 2015-10-22 ENCOUNTER — Encounter: Payer: Self-pay | Admitting: General Practice

## 2015-10-23 ENCOUNTER — Telehealth: Payer: Self-pay | Admitting: Family Medicine

## 2015-10-23 NOTE — Telephone Encounter (Signed)
Pt lvm . Returned pt's call, she says that she had an appt for this Friday to be seen for her BP but was thinking to be seen at a later date. Pt made the statement that her BP is through the ruff. I then suggested to pt to come in to see provider sooner and also suggested her speaking with a nurse ([facility]). Pt declined speaking with a nurse  because she says that she feels okay she just knows that it is high. Pt did okay coming in to a sooner appt. I rescheduled pt to come in to see PCP tomorrow 1/26 instead.    Thanks.

## 2015-10-24 ENCOUNTER — Encounter: Payer: Self-pay | Admitting: Family Medicine

## 2015-10-24 ENCOUNTER — Ambulatory Visit (INDEPENDENT_AMBULATORY_CARE_PROVIDER_SITE_OTHER): Payer: Medicare Other | Admitting: Family Medicine

## 2015-10-24 VITALS — BP 145/78 | HR 65 | Temp 97.6°F | Ht 66.0 in | Wt 164.4 lb

## 2015-10-24 DIAGNOSIS — IMO0001 Reserved for inherently not codable concepts without codable children: Secondary | ICD-10-CM

## 2015-10-24 DIAGNOSIS — R030 Elevated blood-pressure reading, without diagnosis of hypertension: Secondary | ICD-10-CM

## 2015-10-24 NOTE — Progress Notes (Signed)
   Subjective:    Patient ID: Kristen Huffman, female    DOB: [birth year], 70 y.o.   MRN: [id number]  HPI Elevated BP- pt went to vein specialist and BP 159/90.  Admits to component of white coat hypertension and anxiety about doctors.  Has been taking BP at home and it was also elevated.  Home BPs running 127-173/70-97.  Pt has been very anxious about BPs but otherwise feeling ok.  No CP, SOB, HAs, visual changes, edema.  Pt is exercising regularly.   Review of Systems For ROS see HPI     Objective:   Physical Exam  Constitutional: She is oriented to person, place, and time. She appears well-developed and well-nourished. No distress.  HENT:  Head: Normocephalic and atraumatic.  Eyes: Conjunctivae and EOM are normal. Pupils are equal, round, and reactive to light.  Neck: Normal range of motion. Neck supple. No thyromegaly present.  Cardiovascular: Normal rate, regular rhythm, normal heart sounds and intact distal pulses.   No murmur heard. Pulmonary/Chest: Effort normal and breath sounds normal. No respiratory distress.  Abdominal: Soft. She exhibits no distension. There is no tenderness.  Musculoskeletal: She exhibits no edema.  Lymphadenopathy:    She has no cervical adenopathy.  Neurological: She is alert and oriented to person, place, and time.  Skin: Skin is warm and dry.  Psychiatric: She has a normal mood and affect. Her behavior is normal.  Vitals reviewed.         Assessment & Plan:

## 2015-10-24 NOTE — Assessment & Plan Note (Signed)
Pt has had this previously but BP has always normalized.  Asymptomatic.  BP is mildly elevated today but pt was nervous for appt.  Discussed importance of low Na diet, regular exercise to improve BP.  No meds at this time but will follow closely.  Pt expressed understanding and is in agreement w/ plan.

## 2015-10-24 NOTE — Progress Notes (Signed)
Pre visit review using our clinic review tool, if applicable. No additional management support is needed unless otherwise documented below in the visit note. 

## 2015-10-24 NOTE — Patient Instructions (Signed)
Follow up as scheduled No medication at this time Continue to exercise regularly and eat low salt diet Drink plenty of water Call with any questions or concerns Hang in there!!!

## 2015-10-25 ENCOUNTER — Ambulatory Visit: Payer: Medicare Other | Admitting: Family Medicine

## 2015-12-02 ENCOUNTER — Encounter: Payer: Self-pay | Admitting: Family Medicine

## 2015-12-02 ENCOUNTER — Ambulatory Visit: Payer: Medicare Other | Admitting: Family Medicine

## 2015-12-02 ENCOUNTER — Ambulatory Visit (INDEPENDENT_AMBULATORY_CARE_PROVIDER_SITE_OTHER): Payer: Medicare Other | Admitting: Family Medicine

## 2015-12-02 VITALS — BP 128/76 | HR 89 | Temp 98.1°F | Resp 16 | Ht 66.0 in | Wt 164.1 lb

## 2015-12-02 DIAGNOSIS — R030 Elevated blood-pressure reading, without diagnosis of hypertension: Secondary | ICD-10-CM

## 2015-12-02 DIAGNOSIS — E785 Hyperlipidemia, unspecified: Secondary | ICD-10-CM | POA: Diagnosis not present

## 2015-12-02 DIAGNOSIS — Z780 Asymptomatic menopausal state: Secondary | ICD-10-CM | POA: Diagnosis not present

## 2015-12-02 DIAGNOSIS — IMO0001 Reserved for inherently not codable concepts without codable children: Secondary | ICD-10-CM

## 2015-12-02 LAB — CBC WITH DIFFERENTIAL/PLATELET
BASOS ABS: 0 10*3/uL (ref 0.0–0.1)
Basophils Relative: 0.7 % (ref 0.0–3.0)
Eosinophils Absolute: 0.2 10*3/uL (ref 0.0–0.7)
Eosinophils Relative: 3.8 % (ref 0.0–5.0)
HCT: 38.1 % (ref 36.0–46.0)
Hemoglobin: 13 g/dL (ref 12.0–15.0)
Lymphocytes Relative: 41.7 % (ref 12.0–46.0)
Lymphs Abs: 2.2 10*3/uL (ref 0.7–4.0)
MCHC: 34.1 g/dL (ref 30.0–36.0)
MCV: 83.6 fl (ref 78.0–100.0)
MONOS PCT: 8.1 % (ref 3.0–12.0)
Monocytes Absolute: 0.4 10*3/uL (ref 0.1–1.0)
NEUTROS ABS: 2.5 10*3/uL (ref 1.4–7.7)
NEUTROS PCT: 45.7 % (ref 43.0–77.0)
PLATELETS: 188 10*3/uL (ref 150.0–400.0)
RBC: 4.55 Mil/uL (ref 3.87–5.11)
RDW: 13.9 % (ref 11.5–15.5)
WBC: 5.4 10*3/uL (ref 4.0–10.5)

## 2015-12-02 LAB — BASIC METABOLIC PANEL
BUN: 14 mg/dL (ref 6–23)
CALCIUM: 9.6 mg/dL (ref 8.4–10.5)
CHLORIDE: 105 meq/L (ref 96–112)
CO2: 31 mEq/L (ref 19–32)
Creatinine, Ser: 0.64 mg/dL (ref 0.40–1.20)
GFR: 98.25 mL/min (ref >60.00)
Glucose, Bld: 91 mg/dL (ref 70–99)
Potassium: 4.1 mEq/L (ref 3.5–5.1)
Sodium: 141 mEq/L (ref 135–145)

## 2015-12-02 LAB — LIPID PANEL
Cholesterol: 181 mg/dL (ref 0–200)
HDL: 42.7 mg/dL (ref >39.00)
LDL Cholesterol: 105 mg/dL — ABNORMAL HIGH (ref 0–99)
NONHDL: 138.67
Total CHOL/HDL Ratio: 4
Triglycerides: 170 mg/dL — ABNORMAL HIGH (ref 0.0–149.0)
VLDL: 34 mg/dL (ref 0.0–40.0)

## 2015-12-02 LAB — HEPATIC FUNCTION PANEL
ALK PHOS: 64 U/L (ref 39–117)
ALT: 19 U/L (ref 0–35)
AST: 21 U/L (ref 0–37)
Albumin: 4.3 g/dL (ref 3.5–5.2)
BILIRUBIN DIRECT: 0.1 mg/dL (ref 0.0–0.3)
Total Bilirubin: 0.5 mg/dL (ref 0.2–1.2)
Total Protein: 6.8 g/dL (ref 6.0–8.3)

## 2015-12-02 NOTE — Progress Notes (Signed)
Pre visit review using our clinic review tool, if applicable. No additional management support is needed unless otherwise documented below in the visit note. 

## 2015-12-02 NOTE — Assessment & Plan Note (Signed)
Much improved since last visit.  No meds required.  Continue healthy diet and regular exercise.  Will follow.

## 2015-12-02 NOTE — Assessment & Plan Note (Signed)
Chronic problem.  Tolerating statin w/o difficulty.  Check labs.  Adjust meds prn  

## 2015-12-02 NOTE — Progress Notes (Signed)
   Subjective:    Patient ID: Kristen Huffman, female    DOB: 12/14/1945, 70 y.o.   MRN: [id number]  HPI Hyperlipidemia- chronic problem, on Lipitor daily.  Pt is typically excellent on diet and exercise but admits to poor eating recently.  No abd pain, N/V, myalgias.  Elevated BP- BP is much better today than previous reading of 145/78.  Denies CP, SOB, HAs, visual changes, edema.  Post-menopausal- pt is overdue for bone density  Review of Systems For ROS see HPI     Objective:   Physical Exam  Constitutional: She is oriented to person, place, and time. She appears well-developed and well-nourished. No distress.  HENT:  Head: Normocephalic and atraumatic.  Eyes: Conjunctivae and EOM are normal. Pupils are equal, round, and reactive to light.  Neck: Normal range of motion. Neck supple. No thyromegaly present.  Cardiovascular: Normal rate, regular rhythm, normal heart sounds and intact distal pulses.   No murmur heard. Pulmonary/Chest: Effort normal and breath sounds normal. No respiratory distress.  Abdominal: Soft. She exhibits no distension. There is no tenderness.  Musculoskeletal: She exhibits no edema.  Lymphadenopathy:    She has no cervical adenopathy.  Neurological: She is alert and oriented to person, place, and time.  Skin: Skin is warm and dry.  Psychiatric: She has a normal mood and affect. Her behavior is normal.  Vitals reviewed.         Assessment & Plan:

## 2015-12-02 NOTE — Patient Instructions (Signed)
Schedule your complete physical in 6 months We'll notify you of your lab results and make any changes if needed Keep up the good work on healthy diet and regular exercise- you look great! We'll [facility] Call with any questions or concerns Happy Spring!!!

## 2015-12-02 NOTE — Assessment & Plan Note (Signed)
New.  Pt is due for DEXA.  Order entered for [facility].  Will follow.

## 2015-12-09 ENCOUNTER — Ambulatory Visit: Payer: Medicare Other | Admitting: Family Medicine

## 2016-03-16 ENCOUNTER — Other Ambulatory Visit: Payer: Self-pay | Admitting: General Practice

## 2016-03-16 MED ORDER — ATORVASTATIN CALCIUM 10 MG PO TABS: ORAL_TABLET | ORAL | Status: DC

## 2016-05-25 ENCOUNTER — Telehealth: Payer: Self-pay | Admitting: Pulmonary Disease

## 2016-05-25 NOTE — Telephone Encounter (Signed)
Spoke with pt. She is not needing an appointment with RA. States that she chose the wrong the physician when sending the appointment request. Nothing further was needed.

## 2016-06-10 ENCOUNTER — Encounter: Payer: Self-pay | Admitting: Family Medicine

## 2016-06-10 ENCOUNTER — Ambulatory Visit: Payer: Medicare Other

## 2016-06-10 ENCOUNTER — Ambulatory Visit (INDEPENDENT_AMBULATORY_CARE_PROVIDER_SITE_OTHER): Payer: Medicare Other | Admitting: Family Medicine

## 2016-06-10 VITALS — BP 128/88 | HR 87 | Temp 98.1°F | Resp 16 | Ht 66.0 in | Wt 161.0 lb

## 2016-06-10 DIAGNOSIS — Z780 Asymptomatic menopausal state: Secondary | ICD-10-CM

## 2016-06-10 DIAGNOSIS — Z1231 Encounter for screening mammogram for malignant neoplasm of breast: Secondary | ICD-10-CM | POA: Diagnosis not present

## 2016-06-10 DIAGNOSIS — Z23 Encounter for immunization: Secondary | ICD-10-CM

## 2016-06-10 DIAGNOSIS — E785 Hyperlipidemia, unspecified: Secondary | ICD-10-CM | POA: Diagnosis not present

## 2016-06-10 DIAGNOSIS — Z0000 Encounter for general adult medical examination without abnormal findings: Secondary | ICD-10-CM | POA: Diagnosis not present

## 2016-06-10 LAB — TSH: TSH: 2.15 u[IU]/mL (ref 0.35–4.50)

## 2016-06-10 LAB — CBC WITH DIFFERENTIAL/PLATELET
BASOS PCT: 0.7 % (ref 0.0–3.0)
Basophils Absolute: 0 10*3/uL (ref 0.0–0.1)
Eosinophils Absolute: 0.2 10*3/uL (ref 0.0–0.7)
Eosinophils Relative: 3.4 % (ref 0.0–5.0)
HCT: 39.2 % (ref 36.0–46.0)
Hemoglobin: 13.3 g/dL (ref 12.0–15.0)
Lymphocytes Relative: 40.3 % (ref 12.0–46.0)
Lymphs Abs: 2.2 10*3/uL (ref 0.7–4.0)
MCHC: 33.8 g/dL (ref 30.0–36.0)
MCV: 84.2 fl (ref 78.0–100.0)
MONO ABS: 0.5 10*3/uL (ref 0.1–1.0)
Monocytes Relative: 9.2 % (ref 3.0–12.0)
Neutro Abs: 2.5 10*3/uL (ref 1.4–7.7)
Neutrophils Relative %: 46.4 % (ref 43.0–77.0)
PLATELETS: 178 10*3/uL (ref 150.0–400.0)
RBC: 4.66 Mil/uL (ref 3.87–5.11)
RDW: 14.1 % (ref 11.5–15.5)
WBC: 5.4 10*3/uL (ref 4.0–10.5)

## 2016-06-10 LAB — BASIC METABOLIC PANEL
BUN: 13 mg/dL (ref 6–23)
CHLORIDE: 106 meq/L (ref 96–112)
CO2: 31 mEq/L (ref 19–32)
Calcium: 9.3 mg/dL (ref 8.4–10.5)
Creatinine, Ser: 0.66 mg/dL (ref 0.40–1.20)
GFR: 94.68 mL/min (ref >60.00)
Glucose, Bld: 77 mg/dL (ref 70–99)
POTASSIUM: 4.6 meq/L (ref 3.5–5.1)
SODIUM: 143 meq/L (ref 135–145)

## 2016-06-10 LAB — HEPATIC FUNCTION PANEL
ALBUMIN: 4.4 g/dL (ref 3.5–5.2)
ALK PHOS: 69 U/L (ref 39–117)
ALT: 18 U/L (ref 0–35)
AST: 20 U/L (ref 0–37)
Bilirubin, Direct: 0.1 mg/dL (ref 0.0–0.3)
Total Bilirubin: 0.5 mg/dL (ref 0.2–1.2)
Total Protein: 6.9 g/dL (ref 6.0–8.3)

## 2016-06-10 LAB — LIPID PANEL
CHOL/HDL RATIO: 4
CHOLESTEROL: 194 mg/dL (ref 0–200)
HDL: 43.9 mg/dL (ref >39.00)
LDL Cholesterol: 124 mg/dL — ABNORMAL HIGH (ref 0–99)
NonHDL: 150.11
TRIGLYCERIDES: 129 mg/dL (ref 0.0–149.0)
VLDL: 25.8 mg/dL (ref 0.0–40.0)

## 2016-06-10 NOTE — Progress Notes (Signed)
   Subjective:    Patient ID: Kristen Huffman, female    DOB: 09/30/1945, 70 y.o.   MRN: [id number]  HPI Here today for CPE.  Risk Factors: Hyperlipidemia- chronic problem, on Lipitor daily. Physical Activity: exercising regularly Fall Risk: low risk Depression: denies current sxs Hearing: normal to conversational tones and whispered voice at 6 ft ADL's: independent Cognitive: normal linear thought process, memory and attention intact Home Safety: safe at home Height, Weight, BMI, Visual Acuity: see vitals, vision corrected to 20/20 w/ glasses Counseling: UTD on colonoscopy, pneumonia vaccines.  Due for flu today.  [facility] Care team reviewed and updated Labs Ordered: See A&P Care Plan: See A&P    Review of Systems Patient reports no vision/ hearing changes, adenopathy,fever, weight change,  persistant/recurrent hoarseness , swallowing issues, chest pain, palpitations, edema, persistant/recurrent cough, hemoptysis, dyspnea (rest/exertional/paroxysmal nocturnal), gastrointestinal bleeding (melena, rectal bleeding), abdominal pain, significant heartburn, bowel changes, GU symptoms (dysuria, hematuria, incontinence), Gyn symptoms (abnormal  bleeding, pain),  syncope, focal weakness, memory loss, numbness & tingling, skin/hair/nail changes, abnormal bruising or bleeding, anxiety, or depression.     Objective:   Physical Exam General Appearance:    Alert, cooperative, no distress, appears stated age  Head:    Normocephalic, without obvious abnormality, atraumatic  Eyes:    PERRL, conjunctiva/corneas clear, EOM's intact, fundi    benign, both eyes  Ears:    Normal TM's and external ear canals, both ears  Nose:   Nares normal, septum midline, mucosa normal, no drainage    or sinus tenderness  Throat:   Lips, mucosa, and tongue normal; teeth and gums normal  Neck:   Supple, symmetrical, trachea midline, no adenopathy;    Thyroid: no  enlargement/tenderness/nodules  Back:     Symmetric, no curvature, ROM normal, no CVA tenderness  Lungs:     Clear to auscultation bilaterally, respirations unlabored  Chest Wall:    No tenderness or deformity   Heart:    Regular rate and rhythm, S1 and S2 normal, no murmur, rub   or gallop  Breast Exam:    Deferred to mammo  Abdomen:     Soft, non-tender, bowel sounds active all four quadrants,    no masses, no organomegaly  Genitalia:    Deferred  Rectal:    Extremities:   Extremities normal, atraumatic, no cyanosis or edema  Pulses:   2+ and symmetric all extremities  Skin:   Skin color, texture, turgor normal, no rashes or lesions  Lymph nodes:   Cervical, supraclavicular, and axillary nodes normal  Neurologic:   CNII-XII intact, normal strength, sensation and reflexes    throughout          Assessment & Plan:  Physical exam- pt's PE WNL.  UTD on colonoscopy.  Due for mammo and DEXA- orders entered.  UTD on pneumonia vaccines, flu shot given today.  Check labs.  Anticipatory guidance provided.   Hyperlipidemia- chronic problem.  Tolerating statin w/o difficulty.  Check labs.  Adjust meds prn

## 2016-06-10 NOTE — Patient Instructions (Signed)
Follow up in 6 months to recheck cholesterol We'll notify you of your lab results and make any changes if needed Continue to work on healthy diet and regular exercise- you look great!!! You are up to date on colonoscopy- you are not due until at least 2020! We'll call you with your mammogram and bone density appts Call with any questions or concerns Happy Early Birthday!!!

## 2016-06-10 NOTE — Progress Notes (Signed)
Pre visit review using our clinic review tool, if applicable. No additional management support is needed unless otherwise documented below in the visit note. 

## 2016-07-12 LAB — HM MAMMOGRAPHY: HM MAMMO: NORMAL (ref 0–4)

## 2016-07-12 LAB — HM DEXA SCAN: HM Dexa Scan: NORMAL

## 2016-09-08 ENCOUNTER — Other Ambulatory Visit: Payer: Self-pay | Admitting: Family Medicine

## 2016-09-11 ENCOUNTER — Encounter: Payer: Self-pay | Admitting: Family Medicine

## 2016-09-11 ENCOUNTER — Ambulatory Visit (INDEPENDENT_AMBULATORY_CARE_PROVIDER_SITE_OTHER): Payer: Medicare Other | Admitting: Family Medicine

## 2016-09-11 VITALS — BP 126/86 | HR 75 | Temp 97.9°F | Resp 17 | Ht 66.0 in | Wt 164.0 lb

## 2016-09-11 DIAGNOSIS — J0100 Acute maxillary sinusitis, unspecified: Secondary | ICD-10-CM

## 2016-09-11 MED ORDER — SULFAMETHOXAZOLE-TRIMETHOPRIM 800-160 MG PO TABS: 1.0000 | ORAL_TABLET | Freq: Two times a day (BID) | ORAL | 0 refills | Status: DC

## 2016-09-11 MED ORDER — PROMETHAZINE-DM 6.25-15 MG/5ML PO SYRP: 5.0000 mL | ORAL_SOLUTION | Freq: Four times a day (QID) | ORAL | 0 refills | Status: DC | PRN

## 2016-09-11 NOTE — Progress Notes (Signed)
Pre visit review using our clinic review tool, if applicable. No additional management support is needed unless otherwise documented below in the visit note. 

## 2016-09-11 NOTE — Progress Notes (Signed)
   Subjective:    Patient ID: Kristen Huffman, female    DOB: 01/10/1946, 70 y.o.   MRN: [id number]  HPI URI- sxs started ~1 week ago.  She thought it was getting better but it worsened yesterday and kept her up all night.  No fever.  Cough is not productive, 'a dry hacking cough'.  Pt reports chest feels test.  sxs started as a sore throat but that resolved.  + maxillary sinus pressure.  No ear pain.  No known sick contacts.  Review of Systems For ROS see HPI     Objective:   Physical Exam  Constitutional: She is oriented to person, place, and time. She appears well-developed and well-nourished. No distress.  HENT:  Head: Normocephalic and atraumatic.  Right Ear: Tympanic membrane normal.  Left Ear: Tympanic membrane normal.  Nose: Mucosal edema and rhinorrhea present. Right sinus exhibits maxillary sinus tenderness and frontal sinus tenderness. Left sinus exhibits maxillary sinus tenderness and frontal sinus tenderness.  Mouth/Throat: Uvula is midline and mucous membranes are normal. Posterior oropharyngeal erythema present. No oropharyngeal exudate.  Eyes: Conjunctivae and EOM are normal. Pupils are equal, round, and reactive to light.  Neck: Normal range of motion. Neck supple.  Cardiovascular: Normal rate, regular rhythm and normal heart sounds.   Pulmonary/Chest: Effort normal and breath sounds normal. No respiratory distress. She has no wheezes.  Lymphadenopathy:    She has no cervical adenopathy.  Neurological: She is alert and oriented to person, place, and time.  Skin: Skin is warm and dry.  Psychiatric: She has a normal mood and affect. Her behavior is normal. Thought content normal.  Vitals reviewed.         Assessment & Plan:  Sinusitis- pt's sxs and PE consistent w/ infxn.  Start abx.  Cough meds prn.  Reviewed supportive care and red flags that should prompt return.  Pt expressed understanding and is in agreement w/ plan.

## 2016-09-11 NOTE — Patient Instructions (Signed)
Follow up as needed or as scheduled Start the Bactrim twice daily- take w/ food Use the cough syrup as needed- may cause drowsiness Continue Mucinex DM for daytime cough- will not cause drowsiness Drink plenty of fluids REST! Call with any questions or concerns Hang in there!!! Happy Holidays!!!

## 2016-09-23 ENCOUNTER — Telehealth: Payer: Self-pay | Admitting: *Deleted

## 2016-09-23 NOTE — Telephone Encounter (Signed)
Please make sure she is taking Mucinex DM in addition to the cough syrup.  Due to her allergy to Codeine, our hands are tied for any other prescription cough meds (b/c they either contain codeine or hydrocodone).  If she has an albuterol inhaler, she should use it to help w/ cough.

## 2016-09-23 NOTE — Telephone Encounter (Signed)
Patient was seen on 09/11/16 for sinus issues/congestion.  She states that her cough is still really bad and the cough medicine that she was given does not seem to be helping. She is asking if anything else can be sent in for her cough.    Patient aware that I am sending message to Dr. Beverely Lowabori for advice and that we will call her back.

## 2016-09-23 NOTE — Telephone Encounter (Signed)
Patient informed of information.   Stated verbal understanding and agreed to starting to use the Mucinex DM and the inhaler.   Patient appreciative of the call.

## 2016-12-10 ENCOUNTER — Encounter: Payer: Self-pay | Admitting: General Practice

## 2016-12-10 ENCOUNTER — Ambulatory Visit (INDEPENDENT_AMBULATORY_CARE_PROVIDER_SITE_OTHER): Payer: Medicare Other | Admitting: Family Medicine

## 2016-12-10 ENCOUNTER — Encounter: Payer: Self-pay | Admitting: Family Medicine

## 2016-12-10 VITALS — BP 123/78 | HR 65 | Temp 98.1°F | Resp 16 | Ht 66.0 in | Wt 165.2 lb

## 2016-12-10 DIAGNOSIS — E785 Hyperlipidemia, unspecified: Secondary | ICD-10-CM

## 2016-12-10 LAB — BASIC METABOLIC PANEL
BUN: 13 mg/dL (ref 6–23)
CO2: 30 mEq/L (ref 19–32)
CREATININE: 0.62 mg/dL (ref 0.40–1.20)
Calcium: 10 mg/dL (ref 8.4–10.5)
Chloride: 106 mEq/L (ref 96–112)
GFR: 101.61 mL/min (ref >60.00)
GLUCOSE: 90 mg/dL (ref 70–99)
POTASSIUM: 4.2 meq/L (ref 3.5–5.1)
Sodium: 144 mEq/L (ref 135–145)

## 2016-12-10 LAB — LIPID PANEL
CHOLESTEROL: 192 mg/dL (ref 0–200)
HDL: 40.6 mg/dL (ref >39.00)
LDL Cholesterol: 116 mg/dL — ABNORMAL HIGH (ref 0–99)
NonHDL: 151.28
Total CHOL/HDL Ratio: 5
Triglycerides: 177 mg/dL — ABNORMAL HIGH (ref 0.0–149.0)
VLDL: 35.4 mg/dL (ref 0.0–40.0)

## 2016-12-10 LAB — HEPATIC FUNCTION PANEL
ALK PHOS: 70 U/L (ref 39–117)
ALT: 18 U/L (ref 0–35)
AST: 18 U/L (ref 0–37)
Albumin: 4.4 g/dL (ref 3.5–5.2)
BILIRUBIN DIRECT: 0.1 mg/dL (ref 0.0–0.3)
BILIRUBIN TOTAL: 0.4 mg/dL (ref 0.2–1.2)
TOTAL PROTEIN: 6.9 g/dL (ref 6.0–8.3)

## 2016-12-10 NOTE — Progress Notes (Signed)
   Subjective:    Patient ID: Kristen Huffman, female    DOB: 04/18/1946, 71 y.o.   MRN: [id number]  HPI Hyperlipidemia- chronic problem, on Lipitor 10mg  nightly.  Denies abd pain, N/V, myalgias.  No CP, SOB, HAs, visual changes, edema.  No regular exercise- she is sitting with her neighbor on [facility].   Review of Systems For ROS see HPI     Objective:   Physical Exam  Constitutional: She is oriented to person, place, and time. She appears well-developed and well-nourished. No distress.  HENT:  Head: Normocephalic and atraumatic.  Eyes: Conjunctivae and EOM are normal. Pupils are equal, round, and reactive to light.  Neck: Normal range of motion. Neck supple. No thyromegaly present.  Cardiovascular: Normal rate, regular rhythm, normal heart sounds and intact distal pulses.   No murmur heard. Pulmonary/Chest: Effort normal and breath sounds normal. No respiratory distress.  Abdominal: Soft. She exhibits no distension. There is no tenderness.  Musculoskeletal: She exhibits no edema.  Lymphadenopathy:    She has no cervical adenopathy.  Neurological: She is alert and oriented to person, place, and time.  Skin: Skin is warm and dry.  Psychiatric: She has a normal mood and affect. Her behavior is normal.  Vitals reviewed.         Assessment & Plan:

## 2016-12-10 NOTE — Progress Notes (Signed)
Pre visit review using our clinic review tool, if applicable. No additional management support is needed unless otherwise documented below in the visit note. 

## 2016-12-10 NOTE — Patient Instructions (Signed)
Schedule your complete physical in 6 months and your Annual Wellness Visit w/ [facility] We'll notify you of your lab results and make any changes if needed Continue to work on healthy diet and regular exercise- you can do it! Call with any questions or concerns Happy Spring!!!

## 2016-12-10 NOTE — Assessment & Plan Note (Signed)
Chronic problem, tolerating statin w/o difficulty.  Stressed need for healthy diet and regular exercise.  Check labs.  Adjust meds prn  

## 2017-03-05 ENCOUNTER — Other Ambulatory Visit: Payer: Self-pay | Admitting: Family Medicine

## 2017-04-11 ENCOUNTER — Encounter (HOSPITAL_BASED_OUTPATIENT_CLINIC_OR_DEPARTMENT_OTHER): Payer: Self-pay | Admitting: *Deleted

## 2017-04-11 ENCOUNTER — Emergency Department (HOSPITAL_BASED_OUTPATIENT_CLINIC_OR_DEPARTMENT_OTHER)
Admission: EM | Admit: 2017-04-11 | Discharge: 2017-04-11 | Disposition: A | Discharge status: Home / Self Care | Payer: Medicare Other | Attending: Emergency Medicine | Admitting: Emergency Medicine

## 2017-04-11 DIAGNOSIS — R21 Rash and other nonspecific skin eruption: Secondary | ICD-10-CM | POA: Diagnosis present

## 2017-04-11 DIAGNOSIS — Z79899 Other long term (current) drug therapy: Secondary | ICD-10-CM | POA: Diagnosis not present

## 2017-04-11 DIAGNOSIS — Z7982 Long term (current) use of aspirin: Secondary | ICD-10-CM | POA: Diagnosis not present

## 2017-04-11 DIAGNOSIS — F17200 Nicotine dependence, unspecified, uncomplicated: Secondary | ICD-10-CM | POA: Insufficient documentation

## 2017-04-11 DIAGNOSIS — L237 Allergic contact dermatitis due to plants, except food: Secondary | ICD-10-CM | POA: Insufficient documentation

## 2017-04-11 MED ORDER — PREDNISONE 10 MG PO TABS: 20.0000 mg | ORAL_TABLET | Freq: Every day | ORAL | 0 refills | Status: DC

## 2017-04-11 MED ORDER — DEXAMETHASONE SODIUM PHOSPHATE 10 MG/ML IJ SOLN
10.0000 mg | Freq: Once | INTRAMUSCULAR | Status: AC
  Administered 2017-04-11: 10 mg via INTRAMUSCULAR

## 2017-04-11 MED ORDER — DEXAMETHASONE SODIUM PHOSPHATE 10 MG/ML IJ SOLN
10.0000 mg | Freq: Once | INTRAMUSCULAR | Status: DC
  Filled 2017-04-11: qty 1

## 2017-04-11 NOTE — ED Provider Notes (Signed)
[facility] Provider Note   CSN: [id number] Arrival date & time: 04/11/17  1117     History   Chief Complaint Chief Complaint  Patient presents with  . Poison Ivy    HPI Kristen Huffman is a 71 y.o. female who presents with rash began 2 days ago. Patient reports that prior to onset of rash she was working in her garden and thinks that she may have come in contact with some poison ivy. Patient notes that she started having breakouts in her right arm and began spreading to her left upper extremity neck and face. Patient reports that the rash is pruritic but not painful. She denies any fevers. She has been using calamine lotion minimal improvement in symptoms. She denies any new detergents, lotions, soaps. She denies any new exposures, new foods, new medications. She denies eating anything with her having any tick or insect bites. She reports her husband who was also working in the garden has had similar symptoms. She denies any fevers, lip or tongue swelling, difficulty breathing.  The history is provided by the patient.    Past Medical History:  Diagnosis Date  . Dental crown present   . Extensor tenosynovitis of left wrist 01/2015  . History of hepatitis A   . Hyperlipidemia     Patient Active Problem List   Diagnosis Date Noted  . Postmenopausal estrogen deficiency 12/02/2015  . Sleep apnea 02/08/2015  . Sinusitis 09/14/2014  . Poison ivy 06/06/2012  . GERD (gastroesophageal reflux disease) 03/07/2012  . Dysuria 03/05/2011  . Hyperlipidemia 02/10/2011  . Elevated BP 02/10/2011  . Allergic rhinitis 02/10/2011    Past Surgical History:  Procedure Laterality Date  . DORSAL COMPARTMENT RELEASE Left 02/22/2015   Procedure: RELEASE LEFT FIRST DORSAL COMPARTMENT (DEQUERVAIN) AND RADIAL TENOSYNOVECTOMY;  Surgeon: Dominica Severin, MD;  Location: [facility];  Service: Orthopedics;  Laterality: Left;  . EYE FOREIGN BODY REMOVAL    . INCONTINENCE SURGERY    .  KNEE ARTHROSCOPY W/ MENISCAL REPAIR Right   . REPAIR ANKLE LIGAMENT Left   . TENDON REPAIR Left    wrist    OB History    No data available       Home Medications    Prior to Admission medications   Medication Sig Start Date End Date Taking? Authorizing Provider  aspirin 81 MG tablet Take 81 mg by mouth daily.    [provider]  atorvastatin (LIPITOR) 10 MG tablet TAKE 1 TABLET EVERY DAY 03/05/17   Sheliah Hatch, MD  Calcium Carbonate (CALCIUM 600 PO) Take by mouth.    [provider]  cetirizine (ZYRTEC) 10 MG tablet Take 10 mg by mouth daily.    [provider]  Cholecalciferol (VITAMIN D) 2000 UNITS tablet Take 2,000 Units by mouth daily.    [provider]  Cyanocobalamin (VITAMIN B-12 PO) Take 200 mcg by mouth daily.    [provider]  Boris Lown Oil 300 MG CAPS Take 1 capsule by mouth daily.    [provider]  Multiple Vitamin (MULTIVITAMIN) tablet Take 1 tablet by mouth daily.      [provider]  predniSONE (DELTASONE) 10 MG tablet Take 2 tablets (20 mg total) by mouth daily. 04/11/17   Maxwell Caul, PA-C    Family History Family History  Problem Relation Age of Onset  . Heart attack Father 71  . Hypertension Mother   . Heart failure Mother     Social History  Social History  Substance Use Topics  . Smoking status: Former Smoker    Packs/day: 0.50    Years: 15.00    Types: Cigarettes    Quit date: 09/29/1979  . Smokeless tobacco: Never Used  . Alcohol use No     Allergies   Tetanus toxoids; Codeine; Novocain [procaine]; and Penicillins   Review of Systems Review of Systems  Constitutional: Negative for fever.  Respiratory: Negative for shortness of breath.   Skin: Positive for rash.     Physical Exam Updated Vital Signs BP (!) 172/75 (BP Location: Right Arm)   Pulse 60   Temp 98.1 F (36.7 C) (Oral)   Resp 18   Ht 5\' 6"  (1.676 m)   Wt 74.8 kg (165 lb)   SpO2 100%   BMI  26.63 kg/m   Physical Exam  Constitutional: She appears well-developed and well-nourished.  Sitting comfortably on examination table  HENT:  Head: Normocephalic and atraumatic.  Mouth/Throat: Uvula is midline, oropharynx is clear and moist and mucous membranes are normal.  Posterior oropharynx is clear. No evidence or lesions. No evidence of angioedema of the lips or tongue.  Eyes: Conjunctivae and EOM are normal. Right eye exhibits no discharge. Left eye exhibits no discharge. No scleral icterus.  No periorbital edema, erythema or tenderness bilaterally.  Pulmonary/Chest: Effort normal.  Neurological: She is alert.  Skin: Skin is warm and dry. Capillary refill takes less than 2 seconds.  Diffusely scattered mixture of erythematous plaques and vesicles, some with crusting to the right forearm that extends up the right upper extremity and the lower forearm of the left upper extremity. She also has some diffusely scattered erythematous  plaques to the right aspect of her face that extended on her chin and up just below the periorbital region. No rash to palms or soles.  Psychiatric: She has a normal mood and affect. Her speech is normal and behavior is normal.  Nursing note and vitals reviewed.    ED Treatments / Results  Labs (all labs ordered are listed, but only abnormal results are displayed) Labs Reviewed - No data to display  EKG  EKG Interpretation None       Radiology No results found.  Procedures Procedures (including critical care time)  Medications Ordered in ED Medications  dexamethasone (DECADRON) injection 10 mg (10 mg Intramuscular Given 04/11/17 1219)     Initial Impression / Assessment and Plan / ED Course  I have reviewed the triage vital signs and the nursing notes.  Pertinent labs & imaging results that were available during my care of the patient were reviewed by me and considered in my medical decision making (see chart for details).      71 year old female who presents with diffusely scattered erythematous rash consistent with poison ivy. Patient has possible exposure to poison ivy. Husband has same symptoms and was working in the garden. Patient is afebrile, non-toxic appearing, sitting comfortably on examination table. Vital signs reviewed. Patient slightly hypertensive, likely secondary to symptoms. History/physical exam are consistent with poison ivy. The rash is not petechial in nature and is not concerning for RMSF. History/physical exam or not concerning for shingles. Will plan to give Decadron in the department for symptomatically. Since the rash is extending up to her face, will provide a short course of oral steroids. Patient struck to follow-up with her doctor in 24-48 hours. Strict return precautions discussed. Patient's breasts understanding and agreement to plan.   Final Clinical Impressions(s) / ED Diagnoses  Final diagnoses:  Poison ivy    New Prescriptions Discharge Medication List as of 04/11/2017 11:58 AM    START taking these medications   Details  predniSONE (DELTASONE) 10 MG tablet Take 2 tablets (20 mg total) by mouth daily., Starting Sun 04/11/2017, Print         Maxwell CaulLayden, Emanual Lamountain A, PA-C 04/11/17 1309    Rolan BuccoBelfi, Melanie, MD 04/11/17 1319

## 2017-04-11 NOTE — ED Triage Notes (Signed)
Poison noted to bilateral arms and face.

## 2017-04-11 NOTE — Discharge Instructions (Signed)
Follow-up with her primary care doctor in 24-48 hours for further evaluation.  Take the medication as prescribed.  You can use calamine lotion or hydrocortisone cream to help with the itching.  Return the emergency room if any worsening rash, difficulty breathing, swelling of her lips or tongue, chest pain or any other worsening or concerning symptoms

## 2017-04-16 ENCOUNTER — Telehealth: Payer: Self-pay | Admitting: Family Medicine

## 2017-04-16 MED ORDER — PREDNISONE 10 MG PO TABS: ORAL_TABLET | ORAL | 0 refills | Status: DC

## 2017-04-16 NOTE — Telephone Encounter (Signed)
Spoke with pt, new prednisone dose sent to the pharmacy. Pt stated an understanding.

## 2017-04-16 NOTE — Telephone Encounter (Signed)
Please advise, pt was given prednisone at the ED

## 2017-04-16 NOTE — Telephone Encounter (Signed)
Pt states that see was seen at [facility] for poison ivy and states that she is still itching. Pt asking what could see do, please advise

## 2017-04-16 NOTE — Telephone Encounter (Signed)
Pt only got 5 days of prednisone so she is likely to have rebound itching.  Do a Prednisone taper of 10mg  tabs- 3x 3days, 2 x3days, 1 x3 days, #18

## 2017-05-12 ENCOUNTER — Encounter: Payer: Medicare Other | Admitting: Family Medicine

## 2017-06-01 LAB — HM MAMMOGRAPHY: HM Mammogram: NORMAL (ref 0–4)

## 2017-06-02 ENCOUNTER — Telehealth: Payer: Self-pay

## 2017-06-02 NOTE — Telephone Encounter (Signed)
LM requesting call back regarding AWV. Requesting to complete AWV with [facility] on 06/16/17 @ 0800 prior to appt with PCP.

## 2017-06-16 ENCOUNTER — Ambulatory Visit: Payer: Medicare Other

## 2017-06-16 ENCOUNTER — Ambulatory Visit (INDEPENDENT_AMBULATORY_CARE_PROVIDER_SITE_OTHER): Payer: Medicare Other | Admitting: Family Medicine

## 2017-06-16 ENCOUNTER — Other Ambulatory Visit: Payer: Self-pay | Admitting: General Practice

## 2017-06-16 ENCOUNTER — Encounter: Payer: Self-pay | Admitting: Family Medicine

## 2017-06-16 ENCOUNTER — Encounter: Payer: Self-pay | Admitting: General Practice

## 2017-06-16 VITALS — BP 142/90 | HR 51 | Temp 98.1°F | Resp 18 | Ht 66.0 in | Wt 165.0 lb

## 2017-06-16 DIAGNOSIS — E785 Hyperlipidemia, unspecified: Secondary | ICD-10-CM

## 2017-06-16 DIAGNOSIS — Z0000 Encounter for general adult medical examination without abnormal findings: Secondary | ICD-10-CM | POA: Diagnosis not present

## 2017-06-16 DIAGNOSIS — Z23 Encounter for immunization: Secondary | ICD-10-CM | POA: Diagnosis not present

## 2017-06-16 LAB — HEPATIC FUNCTION PANEL
ALBUMIN: 4.3 g/dL (ref 3.5–5.2)
ALT: 19 U/L (ref 0–35)
AST: 21 U/L (ref 0–37)
Alkaline Phosphatase: 66 U/L (ref 39–117)
Bilirubin, Direct: 0.1 mg/dL (ref 0.0–0.3)
TOTAL PROTEIN: 6.4 g/dL (ref 6.0–8.3)
Total Bilirubin: 0.5 mg/dL (ref 0.2–1.2)

## 2017-06-16 LAB — LIPID PANEL
Cholesterol: 182 mg/dL (ref 0–200)
HDL: 45.3 mg/dL (ref >39.00)
LDL Cholesterol: 102 mg/dL — ABNORMAL HIGH (ref 0–99)
NonHDL: 136.99
Total CHOL/HDL Ratio: 4
Triglycerides: 175 mg/dL — ABNORMAL HIGH (ref 0.0–149.0)
VLDL: 35 mg/dL (ref 0.0–40.0)

## 2017-06-16 LAB — TSH: TSH: 1.81 u[IU]/mL (ref 0.35–4.50)

## 2017-06-16 LAB — BASIC METABOLIC PANEL
BUN: 17 mg/dL (ref 6–23)
CALCIUM: 9.6 mg/dL (ref 8.4–10.5)
CO2: 30 mEq/L (ref 19–32)
Chloride: 106 mEq/L (ref 96–112)
Creatinine, Ser: 0.65 mg/dL (ref 0.40–1.20)
GFR: 96.07 mL/min (ref >60.00)
GLUCOSE: 87 mg/dL (ref 70–99)
Potassium: 4.2 mEq/L (ref 3.5–5.1)
Sodium: 142 mEq/L (ref 135–145)

## 2017-06-16 LAB — CBC WITH DIFFERENTIAL/PLATELET
BASOS ABS: 0.1 10*3/uL (ref 0.0–0.1)
Basophils Relative: 1 % (ref 0.0–3.0)
EOS ABS: 0.2 10*3/uL (ref 0.0–0.7)
Eosinophils Relative: 2.8 % (ref 0.0–5.0)
HCT: 38.9 % (ref 36.0–46.0)
Hemoglobin: 12.9 g/dL (ref 12.0–15.0)
LYMPHS ABS: 2.4 10*3/uL (ref 0.7–4.0)
Lymphocytes Relative: 41.6 % (ref 12.0–46.0)
MCHC: 33.1 g/dL (ref 30.0–36.0)
MCV: 86 fl (ref 78.0–100.0)
Monocytes Absolute: 0.5 10*3/uL (ref 0.1–1.0)
Monocytes Relative: 9.1 % (ref 3.0–12.0)
NEUTROS PCT: 45.5 % (ref 43.0–77.0)
Neutro Abs: 2.7 10*3/uL (ref 1.4–7.7)
Platelets: 190 10*3/uL (ref 150.0–400.0)
RBC: 4.52 Mil/uL (ref 3.87–5.11)
RDW: 14 % (ref 11.5–15.5)
WBC: 5.9 10*3/uL (ref 4.0–10.5)

## 2017-06-16 MED ORDER — ZOSTER VAC RECOMB ADJUVANTED 50 MCG/0.5ML IM SUSR: 0.5000 mL | Freq: Once | INTRAMUSCULAR | 1 refills | Status: AC

## 2017-06-16 NOTE — Assessment & Plan Note (Signed)
Pt's PE WNL.  UTD on mammo, colonoscopy, immunizations, DEXA.  Check labs.  Anticipatory guidance provided.  

## 2017-06-16 NOTE — Progress Notes (Signed)
   Subjective:    Patient ID: Kristen Huffman, female    DOB: [birth year], 71 y.o.   MRN: [id number]  HPI CPE- UTD on mammo, DEXA, colonoscopy, immunizations (flu shot given today).     Review of Systems Patient reports no vision/ hearing changes, adenopathy,fever, weight change,  persistant/recurrent hoarseness , swallowing issues, chest pain, palpitations, edema, persistant/recurrent cough, hemoptysis, dyspnea (rest/exertional/paroxysmal nocturnal), gastrointestinal bleeding (melena, rectal bleeding), abdominal pain, significant heartburn, bowel changes, GU symptoms (dysuria, hematuria, incontinence), Gyn symptoms (abnormal  bleeding, pain),  syncope, focal weakness, memory loss, numbness & tingling, skin/hair/nail changes, abnormal bruising or bleeding, anxiety, or depression.     Objective:   Physical Exam General Appearance:    Alert, cooperative, no distress, appears stated age  Head:    Normocephalic, without obvious abnormality, atraumatic  Eyes:    PERRL, conjunctiva/corneas clear, EOM's intact, fundi    benign, both eyes  Ears:    Normal TM's and external ear canals, both ears  Nose:   Nares normal, septum midline, mucosa normal, no drainage    or sinus tenderness  Throat:   Lips, mucosa, and tongue normal; teeth and gums normal  Neck:   Supple, symmetrical, trachea midline, no adenopathy;    Thyroid: no enlargement/tenderness/nodules  Back:     Symmetric, no curvature, ROM normal, no CVA tenderness  Lungs:     Clear to auscultation bilaterally, respirations unlabored  Chest Wall:    No tenderness or deformity   Heart:    Regular rate and rhythm, S1 and S2 normal, no murmur, rub   or gallop  Breast Exam:    Deferred to GYN  Abdomen:     Soft, non-tender, bowel sounds active all four quadrants,    no masses, no organomegaly  Genitalia:    Deferred to GYN  Rectal:    Extremities:   Extremities normal, atraumatic, no cyanosis or edema  Pulses:   2+ and symmetric all extremities    Skin:   Skin color, texture, turgor normal, no rashes or lesions  Lymph nodes:   Cervical, supraclavicular, and axillary nodes normal  Neurologic:   CNII-XII intact, normal strength, sensation and reflexes    throughout          Assessment & Plan:

## 2017-06-16 NOTE — Assessment & Plan Note (Signed)
Chronic problem.  Tolerating statin w/o difficulty.  Check labs.  Adjust meds prn  

## 2017-06-16 NOTE — Patient Instructions (Addendum)
Follow up in 6 months to recheck cholesterol We'll notify you of your lab results and make any changes if needed Continue to work on healthy diet and regular exercise- you look great!!! Call with any questions or concerns Happy Early Birthday!!!  Schedule mammogram.   Shingle vaccine at pharmacy.   Bring a copy of your living will and/or healthcare power of attorney to your next office visit.  Continue doing brain stimulating activities (puzzles, reading, adult coloring books, staying active) to keep memory sharp.   Health Maintenance, Female Adopting a healthy lifestyle and getting preventive care can go a long way to promote health and wellness. Talk with your health care provider about what schedule of regular examinations is right for you. This is a good chance for you to check in with your provider about disease prevention and staying healthy. In between checkups, there are plenty of things you can do on your own. Experts have done a lot of research about which lifestyle changes and preventive measures are most likely to keep you healthy. Ask your health care provider for more information. Weight and diet Eat a healthy diet  Be sure to include plenty of vegetables, fruits, low-fat dairy products, and lean protein.  Do not eat a lot of foods high in solid fats, added sugars, or salt.  Get regular exercise. This is one of the most important things you can do for your health. ? Most adults should exercise for at least 150 minutes each week. The exercise should increase your heart rate and make you sweat (moderate-intensity exercise). ? Most adults should also do strengthening exercises at least twice a week. This is in addition to the moderate-intensity exercise.  Maintain a healthy weight  Body mass index (BMI) is a measurement that can be used to identify possible weight problems. It estimates body fat based on height and weight. Your health care provider can help determine your BMI  and help you achieve or maintain a healthy weight.  For females 55 years of age and older: ? A BMI below 18.5 is considered underweight. ? A BMI of 18.5 to 24.9 is normal. ? A BMI of 25 to 29.9 is considered overweight. ? A BMI of 30 and above is considered obese.  Watch levels of cholesterol and blood lipids  You should start having your blood tested for lipids and cholesterol at 69 years of age, then have this test every 5 years.  You may need to have your cholesterol levels checked more often if: ? Your lipid or cholesterol levels are high. ? You are older than 69 years of age. ? You are at high risk for heart disease.  Cancer screening Lung Cancer  Lung cancer screening is recommended for adults 23-73 years old who are at high risk for lung cancer because of a history of smoking.  A yearly low-dose CT scan of the lungs is recommended for people who: ? Currently smoke. ? Have quit within the past 15 years. ? Have at least a 30-pack-year history of smoking. A pack year is smoking an average of one pack of cigarettes a day for 1 year.  Yearly screening should continue until it has been 15 years since you quit.  Yearly screening should stop if you develop a health problem that would prevent you from having lung cancer treatment.  Breast Cancer  Practice breast self-awareness. This means understanding how your breasts normally appear and feel.  It also means doing regular breast self-exams. Let your health  care provider know about any changes, no matter how small.  If you are in your 20s or 30s, you should have a clinical breast exam (CBE) by a health care provider every 1-3 years as part of a regular health exam.  If you are 71 or older, have a CBE every year. Also consider having a breast X-ray (mammogram) every year.  If you have a family history of breast cancer, talk to your health care provider about genetic screening.  If you are at high risk for breast cancer, talk  to your health care provider about having an MRI and a mammogram every year.  Breast cancer gene (BRCA) assessment is recommended for women who have family members with BRCA-related cancers. BRCA-related cancers include: ? Breast. ? Ovarian. ? Tubal. ? Peritoneal cancers.  Results of the assessment will determine the need for genetic counseling and BRCA1 and BRCA2 testing.  Cervical Cancer Your health care provider may recommend that you be screened regularly for cancer of the pelvic organs (ovaries, uterus, and vagina). This screening involves a pelvic examination, including checking for microscopic changes to the surface of your cervix (Pap test). You may be encouraged to have this screening done every 3 years, beginning at age 81.  For women ages 16-65, health care providers may recommend pelvic exams and Pap testing every 3 years, or they may recommend the Pap and pelvic exam, combined with testing for human papilloma virus (HPV), every 5 years. Some types of HPV increase your risk of cervical cancer. Testing for HPV may also be done on women of any age with unclear Pap test results.  Other health care providers may not recommend any screening for nonpregnant women who are considered low risk for pelvic cancer and who do not have symptoms. Ask your health care provider if a screening pelvic exam is right for you.  If you have had past treatment for cervical cancer or a condition that could lead to cancer, you need Pap tests and screening for cancer for at least 20 years after your treatment. If Pap tests have been discontinued, your risk factors (such as having a new sexual partner) need to be reassessed to determine if screening should resume. Some women have medical problems that increase the chance of getting cervical cancer. In these cases, your health care provider may recommend more frequent screening and Pap tests.  Colorectal Cancer  This type of cancer can be detected and often  prevented.  Routine colorectal cancer screening usually begins at 69 years of age and continues through 69 years of age.  Your health care provider may recommend screening at an earlier age if you have risk factors for colon cancer.  Your health care provider may also recommend using home test kits to check for hidden blood in the stool.  A small camera at the end of a tube can be used to examine your colon directly (sigmoidoscopy or colonoscopy). This is done to check for the earliest forms of colorectal cancer.  Routine screening usually begins at age 68.  Direct examination of the colon should be repeated every 5-10 years through 69 years of age. However, you may need to be screened more often if early forms of precancerous polyps or small growths are found.  Skin Cancer  Check your skin from head to toe regularly.  Tell your health care provider about any new moles or changes in moles, especially if there is a change in a mole's shape or color.  Also tell  your health care provider if you have a mole that is larger than the size of a pencil eraser.  Always use sunscreen. Apply sunscreen liberally and repeatedly throughout the day.  Protect yourself by wearing long sleeves, pants, a wide-brimmed hat, and sunglasses whenever you are outside.  Heart disease, diabetes, and high blood pressure  High blood pressure causes heart disease and increases the risk of stroke. High blood pressure is more likely to develop in: ? People who have blood pressure in the high end of the normal range (130-139/85-89 mm Hg). ? People who are overweight or obese. ? People who are African American.  If you are 87-32 years of age, have your blood pressure checked every 3-5 years. If you are 74 years of age or older, have your blood pressure checked every year. You should have your blood pressure measured twice-once when you are at a hospital or clinic, and once when you are not at a hospital or clinic.  Record the average of the two measurements. To check your blood pressure when you are not at a hospital or clinic, you can use: ? An automated blood pressure machine at a pharmacy. ? A home blood pressure monitor.  If you are between 62 years and 71 years old, ask your health care provider if you should take aspirin to prevent strokes.  Have regular diabetes screenings. This involves taking a blood sample to check your fasting blood sugar level. ? If you are at a normal weight and have a low risk for diabetes, have this test once every three years after 69 years of age. ? If you are overweight and have a high risk for diabetes, consider being tested at a younger age or more often. Preventing infection Hepatitis B  If you have a higher risk for hepatitis B, you should be screened for this virus. You are considered at high risk for hepatitis B if: ? You were born in a country where hepatitis B is common. Ask your health care provider which countries are considered high risk. ? Your parents were born in a high-risk country, and you have not been immunized against hepatitis B (hepatitis B vaccine). ? You have HIV or AIDS. ? You use needles to inject street drugs. ? You live with someone who has hepatitis B. ? You have had sex with someone who has hepatitis B. ? You get hemodialysis treatment. ? You take certain medicines for conditions, including cancer, organ transplantation, and autoimmune conditions.  Hepatitis C  Blood testing is recommended for: ? Everyone born from [birth year] through 1963. ? Anyone with known risk factors for hepatitis C.  Sexually transmitted infections (STIs)  You should be screened for sexually transmitted infections (STIs) including gonorrhea and chlamydia if: ? You are sexually active and are younger than 69 years of age. ? You are older than 69 years of age and your health care provider tells you that you are at risk for this type of infection. ? Your sexual  activity has changed since you were last screened and you are at an increased risk for chlamydia or gonorrhea. Ask your health care provider if you are at risk.  If you do not have HIV, but are at risk, it may be recommended that you take a prescription medicine daily to prevent HIV infection. This is called pre-exposure prophylaxis (PrEP). You are considered at risk if: ? You are sexually active and do not regularly use condoms or know the HIV status of your partner(s). ?  injection. ? You are sexually active with a partner who has HIV.  Talk with your health care provider about whether you are at high risk of being infected with HIV. If you choose to begin PrEP, you should first be tested for HIV. You should then be tested every 3 months for as long as you are taking PrEP. Pregnancy  If you are premenopausal and you may become pregnant, ask your health care provider about preconception counseling.  If you may become pregnant, take 400 to 800 micrograms (mcg) of folic acid every day.  If you want to prevent pregnancy, talk to your health care provider about birth control (contraception). Osteoporosis and menopause  Osteoporosis is a disease in which the bones lose minerals and strength with aging. This can result in serious bone fractures. Your risk for osteoporosis can be identified using a bone density scan.  If you are 65 years of age or older, or if you are at risk for osteoporosis and fractures, ask your health care provider if you should be screened.  Ask your health care provider whether you should take a calcium or vitamin D supplement to lower your risk for osteoporosis.  Menopause may have certain physical symptoms and risks.  Hormone replacement therapy may reduce some of these symptoms and risks. Talk to your health care provider about whether hormone replacement therapy is right for you. Follow these instructions at home:  Schedule regular health, dental, and eye exams.  Stay current  with your immunizations.  Do not use any tobacco products including cigarettes, chewing tobacco, or electronic cigarettes.  If you are pregnant, do not drink alcohol.  If you are breastfeeding, limit how much and how often you drink alcohol.  Limit alcohol intake to no more than 1 drink per day for nonpregnant women. One drink equals 12 ounces of beer, 5 ounces of wine, or 1 ounces of hard liquor.  Do not use street drugs.  Do not share needles.  Ask your health care provider for help if you need support or information about quitting drugs.  Tell your health care provider if you often feel depressed.  Tell your health care provider if you have ever been abused or do not feel safe at home. This information is not intended to replace advice given to you by your health care provider. Make sure you discuss any questions you have with your health care provider. Document Released: 03/30/2011 Document Revised: 02/20/2016 Document Reviewed: 06/18/2015 [employer]vier Interactive Patient Education  2018 [employer]  

## 2017-06-16 NOTE — Progress Notes (Signed)
Subjective:   Kristen Huffman is a 71 y.o. female who presents for Medicare Annual (Subsequent) preventive examination.  Review of Systems:  No ROS.  Medicare Wellness Visit. Additional risk factors are reflected in the social history.  Cardiac Risk Factors include: family history of premature cardiovascular disease;advanced age (>8men, >55 women);dyslipidemia   Sleep patterns: Home Safety/Smoke Alarms: Feels safe in home. Smoke alarms in place.  Living environment; residence and Firearm Safety:  Seat Belt Safety/Bike Helmet: Wears seat belt.   Female:   Pap-2015, followed by Dr. Cliffton Asters.     Mammo-07/12/2016, pt reports normal. Will call to schedule.  Dexa scan-07/12/2016,normal.        CCS-Colonoscopy 09/28/2012, pt reports normal. Recall 10 years.      Objective:     Vitals: BP (!) 142/90 (BP Location: Left Arm, Patient Position: Sitting, Cuff Size: Normal)   Pulse (!) 51   Temp 98.1 F (36.7 C) (Temporal)   Resp 18   Ht  (1.676 m)   Wt 165 lb (74.8 kg)   SpO2 98%   BMI 26.63 kg/m   Body mass index is 26.63 kg/m.   Tobacco History  Smoking Status  . Former Smoker  . Packs/day: 0.50  . Years: 15.00  . Types: Cigarettes  . Quit date: 09/29/1979  Smokeless Tobacco  . Never Used     Counseling given: Not Answered   Past Medical History:  Diagnosis Date  . Dental crown present   . Extensor tenosynovitis of left wrist 01/2015  . History of hepatitis A   . Hyperlipidemia    Past Surgical History:  Procedure Laterality Date  . DORSAL COMPARTMENT RELEASE Left 02/22/2015   Procedure: RELEASE LEFT FIRST DORSAL COMPARTMENT (DEQUERVAIN) AND RADIAL TENOSYNOVECTOMY;  Surgeon: Dominica Severin, MD;  Location: [facility];  Service: Orthopedics;  Laterality: Left;  . EYE FOREIGN BODY REMOVAL    . INCONTINENCE SURGERY    . KNEE ARTHROSCOPY W/ MENISCAL REPAIR Right   . REPAIR ANKLE LIGAMENT Left   . TENDON REPAIR Left    wrist   Family History  Problem  Relation Age of Onset  . Heart attack Father 52  . Hypertension Mother   . Heart failure Mother    History  Sexual Activity  . Sexual activity: Not on file    Outpatient Encounter Prescriptions as of 06/16/2017  Medication Sig  . aspirin 81 MG tablet Take 81 mg by mouth daily.  Marland Kitchen atorvastatin (LIPITOR) 10 MG tablet TAKE 1 TABLET EVERY DAY  . Calcium Carbonate (CALCIUM 600 PO) Take by mouth.  . cetirizine (ZYRTEC) 10 MG tablet Take 10 mg by mouth daily.  . Cholecalciferol (VITAMIN D) 2000 UNITS tablet Take 2,000 Units by mouth daily.  . Cyanocobalamin (VITAMIN B-12 PO) Take 200 mcg by mouth daily.  Boris Lown Oil 300 MG CAPS Take 1 capsule by mouth daily.  . Multiple Vitamin (MULTIVITAMIN) tablet Take 1 tablet by mouth daily.    . predniSONE (DELTASONE) 10 MG tablet Take 3 tablets by mouth for 3days, then 2 tablets by mouth for 3days, then 1 tablet by mouth for 3 days.  . Zoster Vac Recomb Adjuvanted [facility]) injection Inject 0.5 mLs into the muscle once.   No facility-administered encounter medications on file as of 06/16/2017.     Activities of Daily Living In your present state of health, do you have any difficulty performing the following activities: 06/16/2017  Hearing? N  Vision? N  Difficulty concentrating or making decisions?  N  Walking or climbing stairs? N  Dressing or bathing? N  Doing errands, shopping? N  Preparing Food and eating ? N  Using the Toilet? N  In the past six months, have you accidently leaked urine? N  Do you have problems with loss of bowel control? N  Managing your Medications? N  Managing your Finances? N  Housekeeping or managing your Housekeeping? N  Some recent data might be hidden    Patient Care Team: Sheliah Hatch, MD as PCP - General (Family Medicine) Jeani Hawking, MD as Consulting Physician (Gastroenterology) Ola Spurr., MD as Consulting Physician (Obstetrics and Gynecology) Dominica Severin, MD as Consulting Physician  (Orthopedic Surgery) Mat Carne, DO as Consulting Physician (Optometry) Puschinsky, Adelfa Koh., MD as Consulting Physician (Urology) Leola Brazil (Dermatology)    Assessment:    Physical assessment deferred to PCP.  Exercise Activities and Dietary recommendations Current Exercise Habits: Home exercise routine (Active lifestyle, gardening, housework), Type of exercise: walking, Time (Minutes): 45, Frequency (Times/Week): 5, Weekly Exercise (Minutes/Week): 225, Exercise limited by: None identified   Diet (meal preparation, eat out, water intake, caffeinated beverages, dairy products, fruits and vegetables): Drinks water, soda and coffee.   Breakfast: fruit, english muffin Lunch: vegetables Dinner: protein and vegetables.      Goals    . Weight (lb) < 140 lb (63.5 kg)          Lose weight by eating better.       Fall Risk Fall Risk  06/16/2017 06/10/2016 10/24/2015 06/10/2015 02/08/2015  Falls in the past year? No No No No No   Depression Screen PHQ 2/9 Scores 06/16/2017 06/10/2016 10/24/2015 06/10/2015  PHQ - 2 Score 0 0 0 0  Exception Documentation - - Patient refusal -     Cognitive Function       Ad8 score reviewed for issues:  Issues making decisions: no  Less interest in hobbies / activities: no  Repeats questions, stories (family complaining): no  Trouble using ordinary gadgets (microwave, computer, phone): no  Forgets the month or year: no  Mismanaging finances: no  Remembering appts: no  Daily problems with thinking and/or memory: no Ad8 score is=0     Immunization History  Administered Date(s) Administered  . Influenza Split 09/03/2011  . Influenza, Seasonal, Injecte, Preservative Fre 09/06/2012  . Influenza,inj,Quad PF,6+ Mos 09/25/2013, 05/28/2014, 06/10/2015, 06/10/2016  . Pneumococcal Conjugate-13 05/28/2014  . Pneumococcal Polysaccharide-23 06/10/2015  . Zoster 03/07/2012   Screening Tests Health Maintenance  Topic Date Due  . INFLUENZA  VACCINE  04/28/2017  . Hepatitis C Screening  06/28/2017 (Originally August 19, 1946)  . MAMMOGRAM  07/12/2017  . COLONOSCOPY  09/28/2018  . TETANUS/TDAP  03/07/2022  . DEXA SCAN  Completed  . PNA vac Low Risk Adult  Completed      Plan:    Schedule mammogram.   Shingle vaccine at pharmacy.   Bring a copy of your living will and/or healthcare power of attorney to your next office visit.  Continue doing brain stimulating activities (puzzles, reading, adult coloring books, staying active) to keep memory sharp.    I have personally reviewed and noted the following in the patient's chart:   . Medical and social history . Use of alcohol, tobacco or illicit drugs  . Current medications and supplements . Functional ability and status . Nutritional status . Physical activity . Advanced directives . List of other physicians . Hospitalizations, surgeries, and ER visits in previous 12 months . Vitals . Screenings to  include cognitive, depression, and falls . Referrals and appointments  In addition, I have reviewed and discussed with patient certain preventive protocols, quality metrics, and best practice recommendations. A written personalized care plan for preventive services as well as general preventive health recommendations were provided to patient.     Alysia Penna, RN  06/16/2017   Reviewed RN documentation and agree w/ above.  Neena Rhymes, MD

## 2017-08-23 ENCOUNTER — Other Ambulatory Visit: Payer: Self-pay | Admitting: Family Medicine

## 2017-12-30 ENCOUNTER — Ambulatory Visit: Payer: Medicare Other | Admitting: Family Medicine

## 2017-12-30 ENCOUNTER — Encounter: Payer: Self-pay | Admitting: Family Medicine

## 2017-12-30 ENCOUNTER — Other Ambulatory Visit: Payer: Self-pay

## 2017-12-30 VITALS — BP 122/80 | HR 97 | Temp 98.1°F | Resp 16 | Ht 63.0 in | Wt 163.1 lb

## 2017-12-30 DIAGNOSIS — E785 Hyperlipidemia, unspecified: Secondary | ICD-10-CM | POA: Diagnosis not present

## 2017-12-30 LAB — BASIC METABOLIC PANEL
BUN: 16 mg/dL (ref 6–23)
CALCIUM: 9.7 mg/dL (ref 8.4–10.5)
CO2: 30 mEq/L (ref 19–32)
Chloride: 105 mEq/L (ref 96–112)
Creatinine, Ser: 0.7 mg/dL (ref 0.40–1.20)
GFR: 88.06 mL/min (ref >60.00)
Glucose, Bld: 86 mg/dL (ref 70–99)
Potassium: 4 mEq/L (ref 3.5–5.1)
SODIUM: 143 meq/L (ref 135–145)

## 2017-12-30 LAB — LIPID PANEL
Cholesterol: 161 mg/dL (ref 0–200)
HDL: 41.6 mg/dL (ref >39.00)
LDL CALC: 102 mg/dL — AB (ref 0–99)
NONHDL: 119.59
Total CHOL/HDL Ratio: 4
Triglycerides: 87 mg/dL (ref 0.0–149.0)
VLDL: 17.4 mg/dL (ref 0.0–40.0)

## 2017-12-30 LAB — HEPATIC FUNCTION PANEL
ALK PHOS: 71 U/L (ref 39–117)
ALT: 19 U/L (ref 0–35)
AST: 19 U/L (ref 0–37)
Albumin: 4.3 g/dL (ref 3.5–5.2)
BILIRUBIN DIRECT: 0.1 mg/dL (ref 0.0–0.3)
Total Bilirubin: 0.5 mg/dL (ref 0.2–1.2)
Total Protein: 6.8 g/dL (ref 6.0–8.3)

## 2017-12-30 NOTE — Patient Instructions (Signed)
Schedule your complete physical in 6 months We'll notify you of your lab results and make any changes if needed Continue to work on healthy diet and regular exercise- you look great!!! Call with any questions or concerns Happy Spring!! 

## 2017-12-30 NOTE — Assessment & Plan Note (Signed)
Chronic problem.  Tolerating statin w/o difficulty.  Encouraged healthy diet and regular exercise.  Check labs.  Adjust meds prn. 

## 2017-12-30 NOTE — Progress Notes (Signed)
   Subjective:    Patient ID: Shellee Miloiane Dougan, female    DOB: 06/23/1946, 72 y.o.   MRN: [id number]  HPI Hyperlipidemia- chronic problem, on Lipitor 10mg  daily.  No CP, SOB, HAs, visual changes, abd pain, N/V.  Pt is much more active during warmer months- plans to resume walking.   Review of Systems For ROS see HPI     Objective:   Physical Exam  Constitutional: She is oriented to person, place, and time. She appears well-developed and well-nourished. No distress.  HENT:  Head: Normocephalic and atraumatic.  Eyes: Pupils are equal, round, and reactive to light. Conjunctivae and EOM are normal.  Neck: Normal range of motion. Neck supple. No thyromegaly present.  Cardiovascular: Normal rate, regular rhythm, normal heart sounds and intact distal pulses.  No murmur heard. Pulmonary/Chest: Effort normal and breath sounds normal. No respiratory distress.  Abdominal: Soft. She exhibits no distension. There is no tenderness.  Musculoskeletal: She exhibits no edema.  Lymphadenopathy:    She has no cervical adenopathy.  Neurological: She is alert and oriented to person, place, and time.  Skin: Skin is warm and dry.  Psychiatric: She has a normal mood and affect. Her behavior is normal.  Vitals reviewed.         Assessment & Plan:

## 2017-12-31 ENCOUNTER — Encounter: Payer: Self-pay | Admitting: General Practice

## 2018-02-22 ENCOUNTER — Other Ambulatory Visit: Payer: Self-pay | Admitting: Family Medicine

## 2018-07-21 ENCOUNTER — Encounter: Payer: Medicare Other | Admitting: Family Medicine

## 2018-07-25 LAB — HM MAMMOGRAPHY: HM Mammogram: NORMAL (ref 0–4)

## 2018-07-29 ENCOUNTER — Encounter: Payer: Self-pay | Admitting: Family Medicine

## 2018-07-29 ENCOUNTER — Ambulatory Visit (INDEPENDENT_AMBULATORY_CARE_PROVIDER_SITE_OTHER): Payer: Medicare Other | Admitting: Family Medicine

## 2018-07-29 ENCOUNTER — Other Ambulatory Visit: Payer: Self-pay

## 2018-07-29 VITALS — BP 121/81 | HR 54 | Temp 98.1°F | Resp 16 | Ht 63.0 in | Wt 163.2 lb

## 2018-07-29 DIAGNOSIS — E785 Hyperlipidemia, unspecified: Secondary | ICD-10-CM | POA: Diagnosis not present

## 2018-07-29 DIAGNOSIS — Z0000 Encounter for general adult medical examination without abnormal findings: Secondary | ICD-10-CM | POA: Diagnosis not present

## 2018-07-29 LAB — POCT URINALYSIS DIPSTICK
Bilirubin, UA: NEGATIVE
GLUCOSE UA: NEGATIVE
LEUKOCYTES UA: NEGATIVE
Nitrite, UA: NEGATIVE
Protein, UA: NEGATIVE
RBC UA: NEGATIVE
Spec Grav, UA: 1.015 (ref 1.010–1.025)
Urobilinogen, UA: 0.2 E.U./dL
pH, UA: 6 (ref 5.0–8.0)

## 2018-07-29 LAB — LIPID PANEL
CHOL/HDL RATIO: 4
Cholesterol: 177 mg/dL (ref 0–200)
HDL: 44.3 mg/dL (ref >39.00)
LDL CALC: 109 mg/dL — AB (ref 0–99)
NONHDL: 132.58
TRIGLYCERIDES: 118 mg/dL (ref 0.0–149.0)
VLDL: 23.6 mg/dL (ref 0.0–40.0)

## 2018-07-29 LAB — BASIC METABOLIC PANEL
BUN: 20 mg/dL (ref 6–23)
CO2: 29 mEq/L (ref 19–32)
CREATININE: 0.69 mg/dL (ref 0.40–1.20)
Calcium: 10 mg/dL (ref 8.4–10.5)
Chloride: 105 mEq/L (ref 96–112)
GFR: 89.38 mL/min (ref >60.00)
Glucose, Bld: 82 mg/dL (ref 70–99)
POTASSIUM: 4.2 meq/L (ref 3.5–5.1)
Sodium: 143 mEq/L (ref 135–145)

## 2018-07-29 LAB — CBC WITH DIFFERENTIAL/PLATELET
BASOS PCT: 0.8 % (ref 0.0–3.0)
Basophils Absolute: 0 10*3/uL (ref 0.0–0.1)
EOS PCT: 3.4 % (ref 0.0–5.0)
Eosinophils Absolute: 0.2 10*3/uL (ref 0.0–0.7)
HCT: 41.8 % (ref 36.0–46.0)
HEMOGLOBIN: 13.7 g/dL (ref 12.0–15.0)
Lymphocytes Relative: 43.7 % (ref 12.0–46.0)
Lymphs Abs: 2.6 10*3/uL (ref 0.7–4.0)
MCHC: 32.8 g/dL (ref 30.0–36.0)
MCV: 85.6 fl (ref 78.0–100.0)
MONOS PCT: 8.3 % (ref 3.0–12.0)
Monocytes Absolute: 0.5 10*3/uL (ref 0.1–1.0)
Neutro Abs: 2.6 10*3/uL (ref 1.4–7.7)
Neutrophils Relative %: 43.8 % (ref 43.0–77.0)
Platelets: 194 10*3/uL (ref 150.0–400.0)
RBC: 4.89 Mil/uL (ref 3.87–5.11)
RDW: 14.1 % (ref 11.5–15.5)
WBC: 6 10*3/uL (ref 4.0–10.5)

## 2018-07-29 LAB — HEPATIC FUNCTION PANEL
ALK PHOS: 71 U/L (ref 39–117)
ALT: 20 U/L (ref 0–35)
AST: 20 U/L (ref 0–37)
Albumin: 4.6 g/dL (ref 3.5–5.2)
BILIRUBIN TOTAL: 0.5 mg/dL (ref 0.2–1.2)
Bilirubin, Direct: 0.1 mg/dL (ref 0.0–0.3)
Total Protein: 7.2 g/dL (ref 6.0–8.3)

## 2018-07-29 LAB — TSH: TSH: 2.5 u[IU]/mL (ref 0.35–4.50)

## 2018-07-29 NOTE — Assessment & Plan Note (Signed)
Pt's PE WNL.  UTD on mammo, colonoscopy, immunizations.  Check labs.  Anticipatory guidance provided.  

## 2018-07-29 NOTE — Patient Instructions (Signed)
Follow up in 6 months to recheck cholesterol We'll notify you of your lab results and make any changes if needed Continue to work on healthy diet and regular exercise- you can do it! Call with any questions or concerns Happy Fall!!! 

## 2018-07-29 NOTE — Assessment & Plan Note (Signed)
Chronic problem.  Tolerating statin w/o difficulty.  Check labs.  Adjust meds prn  

## 2018-07-29 NOTE — Progress Notes (Signed)
   Subjective:    Patient ID: Kristen Huffman, female    DOB: Aug 07, 1946, 72 y.o.   MRN: [id number]  HPI CPE- UTD on mammo, colonoscopy, immunizations.  No concerns today.   Review of Systems Patient reports no vision/ hearing changes, adenopathy,fever, weight change,  persistant/recurrent hoarseness , swallowing issues, chest pain, palpitations, edema, persistant/recurrent cough, hemoptysis, dyspnea (rest/exertional/paroxysmal nocturnal), gastrointestinal bleeding (melena, rectal bleeding), abdominal pain, significant heartburn, bowel changes, GU symptoms (dysuria, hematuria, incontinence), Gyn symptoms (abnormal  bleeding, pain),  syncope, focal weakness, memory loss, numbness & tingling, skin/hair/nail changes, abnormal bruising or bleeding, anxiety, or depression.     Objective:   Physical Exam General Appearance:    Alert, cooperative, no distress, appears stated age  Head:    Normocephalic, without obvious abnormality, atraumatic  Eyes:    PERRL, conjunctiva/corneas clear, EOM's intact, fundi    benign, both eyes  Ears:    Normal TM's and external ear canals, both ears  Nose:   Nares normal, septum midline, mucosa normal, no drainage    or sinus tenderness  Throat:   Lips, mucosa, and tongue normal; teeth and gums normal  Neck:   Supple, symmetrical, trachea midline, no adenopathy;    Thyroid: no enlargement/tenderness/nodules  Back:     Symmetric, no curvature, ROM normal, no CVA tenderness  Lungs:     Clear to auscultation bilaterally, respirations unlabored  Chest Wall:    No tenderness or deformity   Heart:    Regular rate and rhythm, S1 and S2 normal, no murmur, rub   or gallop  Breast Exam:    Deferred to mammo  Abdomen:     Soft, non-tender, bowel sounds active all four quadrants,    no masses, no organomegaly  Genitalia:    Deferred  Rectal:    Extremities:   Extremities normal, atraumatic, no cyanosis or edema  Pulses:   2+ and symmetric all extremities  Skin:   Skin  color, texture, turgor normal, no rashes or lesions  Lymph nodes:   Cervical, supraclavicular, and axillary nodes normal  Neurologic:   CNII-XII intact, normal strength, sensation and reflexes    throughout          Assessment & Plan:

## 2018-08-19 ENCOUNTER — Other Ambulatory Visit: Payer: Self-pay | Admitting: Family Medicine

## 2018-08-29 ENCOUNTER — Encounter: Payer: Self-pay | Admitting: Gastroenterology

## 2019-02-01 ENCOUNTER — Encounter: Payer: Self-pay | Admitting: Family Medicine

## 2019-02-01 ENCOUNTER — Ambulatory Visit (INDEPENDENT_AMBULATORY_CARE_PROVIDER_SITE_OTHER): Payer: Medicare Other | Admitting: Family Medicine

## 2019-02-01 ENCOUNTER — Other Ambulatory Visit: Payer: Self-pay

## 2019-02-01 VITALS — HR 70 | Temp 98.1°F | Ht 66.0 in | Wt 165.0 lb

## 2019-02-01 DIAGNOSIS — E785 Hyperlipidemia, unspecified: Secondary | ICD-10-CM | POA: Diagnosis not present

## 2019-02-01 NOTE — Progress Notes (Signed)
   Virtual Visit via Video   I connected with patient on 02/01/19 at 10:00 AM EDT by a video enabled telemedicine application and verified that I am speaking with the correct person using two identifiers.  Location patient: Home Location provider: [facility], Office Persons participating in the virtual visit: Patient, Provider, CMA (Jess B)  I discussed the limitations of evaluation and management by telemedicine and the availability of in person appointments. The patient expressed understanding and agreed to proceed.  Subjective:   HPI:   Hyperlipidemia- chronic problem, on Lipitor 10mg  daily.  Pt has been working in yard, Scientist, water quality.  Pt has limited her walking due to COVID.  No CP, SOB, HAs, abd pain, N/V.  ROS:   See pertinent positives and negatives per HPI.  Patient Active Problem List   Diagnosis Date Noted  . Postmenopausal estrogen deficiency 12/02/2015  . Physical exam 03/07/2012  . GERD (gastroesophageal reflux disease) 03/07/2012  . Hyperlipidemia 02/10/2011  . Elevated BP 02/10/2011  . Allergic rhinitis 02/10/2011    Social History   Tobacco Use  . Smoking status: Former Smoker    Packs/day: 0.50    Years: 15.00    Pack years: 7.50    Types: Cigarettes    Last attempt to quit: 09/29/1979    Years since quitting: 39.3  . Smokeless tobacco: Never Used  Substance Use Topics  . Alcohol use: No    Alcohol/week: 0.0 standard drinks    Current Outpatient Medications:  .  aspirin 81 MG tablet, Take 81 mg by mouth daily., Disp: , Rfl:  .  atorvastatin (LIPITOR) 10 MG tablet, TAKE 1 TABLET EVERY DAY, Disp: 90 tablet, Rfl: 1 .  Calcium Carbonate (CALCIUM 600 PO), Take by mouth., Disp: , Rfl:  .  cetirizine (ZYRTEC) 10 MG tablet, Take 10 mg by mouth daily., Disp: , Rfl:  .  Cholecalciferol (VITAMIN D) 2000 UNITS tablet, Take 2,000 Units by mouth daily., Disp: , Rfl:  .  Cyanocobalamin (VITAMIN B-12 PO), Take 200 mcg by mouth daily., Disp: , Rfl:   .  Krill Oil 300 MG CAPS, Take 1 capsule by mouth daily., Disp: , Rfl:  .  Multiple Vitamin (MULTIVITAMIN) tablet, Take 1 tablet by mouth daily.  , Disp: , Rfl:   Allergies  Allergen Reactions  . Tetanus Toxoids     Pt notes mother almost died and was advised her children never get this injection  . Codeine Other (See Comments)    UNKNOWN - WAS AS A CHILD  . Novocain [Procaine] Itching, Swelling and Rash  . Penicillins Rash    Objective:   Pulse 70   Temp 98.1 F (36.7 C) (Oral)   Ht 5\' 6"  (1.676 m)   Wt 165 lb (74.8 kg)   BMI 26.63 kg/m   AAOx3, NAD NCAT, EOMI No obvious CN deficits Coloring WNL Pt is able to speak clearly, coherently without shortness of breath or increased work of breathing.  Thought process is linear.  Mood is appropriate.   Assessment and Plan:   Hyperlipidemia- chronic problem.  Tolerating statin w/o difficulty.  Encouraged healthy diet and regular exercise.  Check labs.  Adjust meds prn    Neena Rhymes, MD 02/01/2019

## 2019-02-01 NOTE — Progress Notes (Signed)
I have discussed the procedure for the virtual visit with the patient who has given consent to proceed with assessment and treatment.   Jessica L Brodmerkel, CMA     

## 2019-02-06 ENCOUNTER — Other Ambulatory Visit (INDEPENDENT_AMBULATORY_CARE_PROVIDER_SITE_OTHER): Payer: Medicare Other

## 2019-02-06 DIAGNOSIS — E785 Hyperlipidemia, unspecified: Secondary | ICD-10-CM

## 2019-02-06 LAB — LIPID PANEL
Cholesterol: 170 mg/dL (ref 0–200)
HDL: 39.2 mg/dL (ref >39.00)
LDL Cholesterol: 98 mg/dL (ref 0–99)
NonHDL: 130.81
Total CHOL/HDL Ratio: 4
Triglycerides: 164 mg/dL — ABNORMAL HIGH (ref 0.0–149.0)
VLDL: 32.8 mg/dL (ref 0.0–40.0)

## 2019-02-06 LAB — BASIC METABOLIC PANEL
BUN: 18 mg/dL (ref 6–23)
CO2: 28 mEq/L (ref 19–32)
Calcium: 9.3 mg/dL (ref 8.4–10.5)
Chloride: 106 mEq/L (ref 96–112)
Creatinine, Ser: 0.69 mg/dL (ref 0.40–1.20)
GFR: 83.97 mL/min (ref >60.00)
Glucose, Bld: 90 mg/dL (ref 70–99)
Potassium: 4.1 mEq/L (ref 3.5–5.1)
Sodium: 142 mEq/L (ref 135–145)

## 2019-02-06 LAB — HEPATIC FUNCTION PANEL
ALT: 14 U/L (ref 0–35)
AST: 20 U/L (ref 0–37)
Albumin: 4.4 g/dL (ref 3.5–5.2)
Alkaline Phosphatase: 70 U/L (ref 39–117)
Bilirubin, Direct: 0.1 mg/dL (ref 0.0–0.3)
Total Bilirubin: 0.6 mg/dL (ref 0.2–1.2)
Total Protein: 6.6 g/dL (ref 6.0–8.3)

## 2019-02-07 ENCOUNTER — Encounter: Payer: Self-pay | Admitting: General Practice

## 2019-02-09 ENCOUNTER — Other Ambulatory Visit: Payer: Self-pay | Admitting: Family Medicine

## 2019-07-12 ENCOUNTER — Other Ambulatory Visit (HOSPITAL_BASED_OUTPATIENT_CLINIC_OR_DEPARTMENT_OTHER): Payer: Self-pay | Admitting: Family Medicine

## 2019-07-12 DIAGNOSIS — Z1231 Encounter for screening mammogram for malignant neoplasm of breast: Secondary | ICD-10-CM

## 2019-07-27 ENCOUNTER — Ambulatory Visit (HOSPITAL_BASED_OUTPATIENT_CLINIC_OR_DEPARTMENT_OTHER): Payer: Medicare Other

## 2019-08-01 ENCOUNTER — Other Ambulatory Visit: Payer: Self-pay

## 2019-08-01 ENCOUNTER — Encounter (HOSPITAL_BASED_OUTPATIENT_CLINIC_OR_DEPARTMENT_OTHER): Payer: Self-pay

## 2019-08-01 ENCOUNTER — Ambulatory Visit (HOSPITAL_BASED_OUTPATIENT_CLINIC_OR_DEPARTMENT_OTHER)
Admission: RE | Admit: 2019-08-01 | Discharge: 2019-08-01 | Disposition: A | Discharge status: Home / Self Care | Payer: Medicare Other | Source: Ambulatory Visit | Attending: Family Medicine | Admitting: Family Medicine

## 2019-08-01 DIAGNOSIS — Z1231 Encounter for screening mammogram for malignant neoplasm of breast: Secondary | ICD-10-CM | POA: Insufficient documentation

## 2019-08-04 ENCOUNTER — Encounter: Payer: Self-pay | Admitting: Family Medicine

## 2019-08-04 ENCOUNTER — Ambulatory Visit (INDEPENDENT_AMBULATORY_CARE_PROVIDER_SITE_OTHER): Payer: Medicare Other | Admitting: Family Medicine

## 2019-08-04 ENCOUNTER — Other Ambulatory Visit: Payer: Self-pay

## 2019-08-04 VITALS — BP 120/80 | HR 55 | Temp 98.0°F | Resp 16 | Ht 66.0 in | Wt 162.5 lb

## 2019-08-04 DIAGNOSIS — Z0000 Encounter for general adult medical examination without abnormal findings: Secondary | ICD-10-CM | POA: Diagnosis not present

## 2019-08-04 DIAGNOSIS — E785 Hyperlipidemia, unspecified: Secondary | ICD-10-CM | POA: Diagnosis not present

## 2019-08-04 LAB — CBC WITH DIFFERENTIAL/PLATELET
Basophils Absolute: 0.1 K/uL (ref 0.0–0.1)
Basophils Relative: 2.4 % (ref 0.0–3.0)
Eosinophils Absolute: 0.1 K/uL (ref 0.0–0.7)
Eosinophils Relative: 2.5 % (ref 0.0–5.0)
HCT: 39.4 % (ref 36.0–46.0)
Hemoglobin: 13 g/dL (ref 12.0–15.0)
Lymphocytes Relative: 35.7 % (ref 12.0–46.0)
Lymphs Abs: 1.8 K/uL (ref 0.7–4.0)
MCHC: 33.1 g/dL (ref 30.0–36.0)
MCV: 84.8 fl (ref 78.0–100.0)
Monocytes Absolute: 0.5 K/uL (ref 0.1–1.0)
Monocytes Relative: 9.2 % (ref 3.0–12.0)
Neutro Abs: 2.5 K/uL (ref 1.4–7.7)
Neutrophils Relative %: 50.2 % (ref 43.0–77.0)
Platelets: 184 K/uL (ref 150.0–400.0)
RBC: 4.64 Mil/uL (ref 3.87–5.11)
RDW: 14.3 % (ref 11.5–15.5)
WBC: 5 K/uL (ref 4.0–10.5)

## 2019-08-04 LAB — HEPATIC FUNCTION PANEL
ALT: 16 U/L (ref 0–35)
AST: 18 U/L (ref 0–37)
Albumin: 4.6 g/dL (ref 3.5–5.2)
Alkaline Phosphatase: 73 U/L (ref 39–117)
Bilirubin, Direct: 0.1 mg/dL (ref 0.0–0.3)
Total Bilirubin: 0.5 mg/dL (ref 0.2–1.2)
Total Protein: 6.8 g/dL (ref 6.0–8.3)

## 2019-08-04 LAB — LIPID PANEL
Cholesterol: 195 mg/dL (ref 0–200)
HDL: 41.9 mg/dL (ref >39.00)
LDL Cholesterol: 118 mg/dL — ABNORMAL HIGH (ref 0–99)
NonHDL: 152.76
Total CHOL/HDL Ratio: 5
Triglycerides: 174 mg/dL — ABNORMAL HIGH (ref 0.0–149.0)
VLDL: 34.8 mg/dL (ref 0.0–40.0)

## 2019-08-04 LAB — BASIC METABOLIC PANEL WITH GFR
BUN: 15 mg/dL (ref 6–23)
CO2: 29 meq/L (ref 19–32)
Calcium: 9.8 mg/dL (ref 8.4–10.5)
Chloride: 105 meq/L (ref 96–112)
Creatinine, Ser: 0.65 mg/dL (ref 0.40–1.20)
GFR: 89.83 mL/min
Glucose, Bld: 91 mg/dL (ref 70–99)
Potassium: 4.2 meq/L (ref 3.5–5.1)
Sodium: 143 meq/L (ref 135–145)

## 2019-08-04 LAB — TSH: TSH: 2.66 u[IU]/mL (ref 0.35–4.50)

## 2019-08-04 NOTE — Assessment & Plan Note (Signed)
Pt's PE WNL.  UTD on colonoscopy, mammo, immunizations.  Check labs.  Anticipatory guidance provided.  

## 2019-08-04 NOTE — Assessment & Plan Note (Signed)
Chronic problem.  Tolerating statin w/o difficulty.  Exercising regularly.  Check labs.  Adjust meds prn  

## 2019-08-04 NOTE — Progress Notes (Signed)
   Subjective:    Patient ID: Kristen Huffman, female    DOB: 01-16-1946, 73 y.o.   MRN: [id number]  HPI Here today for CPE.  Risk Factors: Hyperlipidemia- chronic problem, on Lipitor 10mg  nightly w/ hx of good control Physical Activity: walking an hour daily Fall Risk: low Depression: denies current symptoms Hearing: normal to conversational tones and whispered voice at 6 ft ADL's: independent Cognitive: normal linear thought process, memory and attention intact Home Safety: safe at home, lives w/ husband Height, Weight, BMI, Visual Acuity: see vitals, vision corrected to 20/20 w/ glasses Counseling: UTD on colonoscopy (Dr Benson Norway), mammo, immunizations Labs Ordered: See A&P Care Plan: See A&P   Patient Care Team    Relationship Specialty Notifications Start End  Midge Minium, MD PCP - General Family Medicine  11/13/11   Carol Ada, MD Consulting Physician Gastroenterology  06/07/15   Roque Cash., MD Consulting Physician Obstetrics and Gynecology  06/07/15   Roseanne Kaufman, MD Consulting Physician Orthopedic Surgery  06/07/15   Shawnie Dapper, DO Consulting Physician Optometry  06/07/15   Puschinsky, Fransico Him., MD Consulting Physician Urology  06/10/15   Janet Berlin  Dermatology  06/16/17       Review of Systems Patient reports no vision/ hearing changes, adenopathy,fever, weight change,  persistant/recurrent hoarseness , swallowing issues, chest pain, palpitations, edema, persistant/recurrent cough, hemoptysis, dyspnea (rest/exertional/paroxysmal nocturnal), gastrointestinal bleeding (melena, rectal bleeding), abdominal pain, significant heartburn, bowel changes, GU symptoms (dysuria, hematuria, incontinence), Gyn symptoms (abnormal  bleeding, pain),  syncope, focal weakness, memory loss, numbness & tingling, skin/hair/nail changes, abnormal bruising or bleeding, anxiety, or depression.     Objective:   Physical Exam General Appearance:    Alert, cooperative, no distress,  appears stated age  Head:    Normocephalic, without obvious abnormality, atraumatic  Eyes:    PERRL, conjunctiva/corneas clear, EOM's intact, fundi    benign, both eyes  Ears:    Normal TM's and external ear canals, both ears  Nose:   Deferred due to COVID  Throat:   Neck:   Supple, symmetrical, trachea midline, no adenopathy;    Thyroid: no enlargement/tenderness/nodules  Back:     Symmetric, no curvature, ROM normal, no CVA tenderness  Lungs:     Clear to auscultation bilaterally, respirations unlabored  Chest Wall:    No tenderness or deformity   Heart:    Regular rate and rhythm, S1 and S2 normal, no murmur, rub   or gallop  Breast Exam:    Deferred to GYN  Abdomen:     Soft, non-tender, bowel sounds active all four quadrants,    no masses, no organomegaly  Genitalia:    Deferred to GYN  Rectal:    Extremities:   Extremities normal, atraumatic, no cyanosis or edema  Pulses:   2+ and symmetric all extremities  Skin:   Skin color, texture, turgor normal, no rashes or lesions  Lymph nodes:   Cervical, supraclavicular, and axillary nodes normal  Neurologic:   CNII-XII intact, normal strength, sensation and reflexes    throughout          Assessment & Plan:

## 2019-08-04 NOTE — Patient Instructions (Signed)
Follow up in 6 months to recheck cholesterol We'll notify you of your lab results and make any changes if needed Keep up the good work on healthy diet and regular exercise- you look great! Call with any questions or concerns Stay Safe!  Stay Healthy!   Preventive Care 19 Years and Older, Female Preventive care refers to lifestyle choices and visits with your health care provider that can promote health and wellness. This includes:  A yearly physical exam. This is also called an annual well check.  Regular dental and eye exams.  Immunizations.  Screening for certain conditions.  Healthy lifestyle choices, such as diet and exercise. What can I expect for my preventive care visit? Physical exam Your health care provider will check:  Height and weight. These may be used to calculate body mass index (BMI), which is a measurement that tells if you are at a healthy weight.  Heart rate and blood pressure.  Your skin for abnormal spots. Counseling Your health care provider may ask you questions about:  Alcohol, tobacco, and drug use.  Emotional well-being.  Home and relationship well-being.  Sexual activity.  Eating habits.  History of falls.  Memory and ability to understand (cognition).  Work and work Statistician.  Pregnancy and menstrual history. What immunizations do I need?  Influenza (flu) vaccine  This is recommended every year. Tetanus, diphtheria, and pertussis (Tdap) vaccine  You may need a Td booster every 10 years. Varicella (chickenpox) vaccine  You may need this vaccine if you have not already been vaccinated. Zoster (shingles) vaccine  You may need this after age 75. Pneumococcal conjugate (PCV13) vaccine  One dose is recommended after age 45. Pneumococcal polysaccharide (PPSV23) vaccine  One dose is recommended after age 4. Measles, mumps, and rubella (MMR) vaccine  You may need at least one dose of MMR if you were born in 1957 or later.  You may also need a second dose. Meningococcal conjugate (MenACWY) vaccine  You may need this if you have certain conditions. Hepatitis A vaccine  You may need this if you have certain conditions or if you travel or work in places where you may be exposed to hepatitis A. Hepatitis B vaccine  You may need this if you have certain conditions or if you travel or work in places where you may be exposed to hepatitis B. Haemophilus influenzae type b (Hib) vaccine  You may need this if you have certain conditions. You may receive vaccines as individual doses or as more than one vaccine together in one shot (combination vaccines). Talk with your health care provider about the risks and benefits of combination vaccines. What tests do I need? Blood tests  Lipid and cholesterol levels. These may be checked every 5 years, or more frequently depending on your overall health.  Hepatitis C test.  Hepatitis B test. Screening  Lung cancer screening. You may have this screening every year starting at age 71 if you have a 30-pack-year history of smoking and currently smoke or have quit within the past 15 years.  Colorectal cancer screening. All adults should have this screening starting at age 71 and continuing until age 71. Your health care provider may recommend screening at age 71 if you are at increased risk. You will have tests every 1-10 years, depending on your results and the type of screening test.  Diabetes screening. This is done by checking your blood sugar (glucose) after you have not eaten for a while (fasting). You may have this  done every 1-3 years.  Mammogram. This may be done every 1-2 years. Talk with your health care provider about how often you should have regular mammograms.  BRCA-related cancer screening. This may be done if you have a family history of breast, ovarian, tubal, or peritoneal cancers. Other tests  Sexually transmitted disease (STD) testing.  Bone density scan.  This is done to screen for osteoporosis. You may have this done starting at age 71. Follow these instructions at home: Eating and drinking  Eat a diet that includes fresh fruits and vegetables, whole grains, lean protein, and low-fat dairy products. Limit your intake of foods with high amounts of sugar, saturated fats, and salt.  Take vitamin and mineral supplements as recommended by your health care provider.  Do not drink alcohol if your health care provider tells you not to drink.  If you drink alcohol: ? Limit how much you have to 0-1 drink a day. ? Be aware of how much alcohol is in your drink. In the [place] one drink equals one 12 oz bottle of beer (355 mL), one 5 oz glass of wine (148 mL), or one 1 oz glass of hard liquor (44 mL). Lifestyle  Take daily care of your teeth and gums.  Stay active. Exercise for at least 30 minutes on 5 or more days each week.  Do not use any products that contain nicotine or tobacco, such as cigarettes, e-cigarettes, and chewing tobacco. If you need help quitting, ask your health care provider.  If you are sexually active, practice safe sex. Use a condom or other form of protection in order to prevent STIs (sexually transmitted infections).  Talk with your health care provider about taking a low-dose aspirin or statin. What's next?  Go to your health care provider once a year for a well check visit.  Ask your health care provider how often you should have your eyes and teeth checked.  Stay up to date on all vaccines. This information is not intended to replace advice given to you by your health care provider. Make sure you discuss any questions you have with your health care provider. Document Released: 10/11/2015 Document Revised: 09/08/2018 Document Reviewed: 09/08/2018 Elsevier Patient Education  2020 [employer].

## 2019-08-05 ENCOUNTER — Encounter: Payer: Self-pay | Admitting: General Practice

## 2019-08-10 ENCOUNTER — Other Ambulatory Visit: Payer: Self-pay | Admitting: Family Medicine

## 2019-10-11 ENCOUNTER — Ambulatory Visit: Payer: Medicare PPO | Admitting: Family Medicine

## 2019-10-11 ENCOUNTER — Other Ambulatory Visit: Payer: Self-pay

## 2019-10-11 ENCOUNTER — Encounter: Payer: Self-pay | Admitting: Family Medicine

## 2019-10-11 VITALS — BP 132/82 | HR 86 | Temp 97.9°F | Resp 16 | Ht 66.0 in | Wt 164.0 lb

## 2019-10-11 DIAGNOSIS — M25422 Effusion, left elbow: Secondary | ICD-10-CM | POA: Diagnosis not present

## 2019-10-11 DIAGNOSIS — M7660 Achilles tendinitis, unspecified leg: Secondary | ICD-10-CM

## 2019-10-11 DIAGNOSIS — M71372 Other bursal cyst, left ankle and foot: Secondary | ICD-10-CM

## 2019-10-11 MED ORDER — PREDNISONE 10 MG PO TABS: ORAL_TABLET | ORAL | 0 refills | Status: DC

## 2019-10-11 NOTE — Progress Notes (Signed)
   Subjective:    Patient ID: Kristen Huffman, female    DOB: September 08, 1946, 74 y.o.   MRN: [id number]  HPI Elbow pain- developed swollen area on L elbow on Tuesday night.  At that time wasn't painful.  Pt feels that resting her elbow while driving home from [place] is the cause.  Not painful.  L Ankle pain- pt reports she will develop pain at base of achilles (point tender) and well circumscribed swelling around medial and lateral malleoli.  The swollen areas are not tender.  Swelling improved w/ ice.  Painful to wear shoes.   Review of Systems For ROS see HPI   This visit occurred during the SARS-CoV-2 public health emergency.  Safety protocols were in place, including screening questions prior to the visit, additional usage of staff PPE, and extensive cleaning of exam room while observing appropriate contact time as indicated for disinfecting solutions.       Objective:   Physical Exam Vitals reviewed.  Constitutional:      General: She is not in acute distress.    Appearance: Normal appearance. She is not ill-appearing.  Cardiovascular:     Pulses: Normal pulses.  Musculoskeletal:        General: Swelling (+ swelling of L olecranon bursa, no redness or TTP, mild swelling of L ankle bursa w/o TTP) and tenderness (TTP over insertion of L achilles) present.  Skin:    General: Skin is warm and dry.  Neurological:     Mental Status: She is alert.           Assessment & Plan:  L olecranon bursitis- new.  No evidence of infxn.  Not red, warm, or painful.  Start pred taper, ice, and joint protection.  Reviewed supportive care and red flags that should prompt return.  Pt expressed understanding and is in agreement w/ plan.   Bursal swelling of L ankle- new.  Mild.  No TTP.  No evidence of infxn.  Start prednisone and ice as above.  L achilles pain- new.  Discussed need for supportive shoes and not flip flops (pt's preferred footwear).  Prednisone should improve pain but if not, pt to let me  know so we can refer to sports med.  Pt expressed understanding and is in agreement w/ plan.

## 2019-10-11 NOTE — Patient Instructions (Addendum)
Follow up by phone or MyChart in 10 days and let me know how things are going.  Sooner if area becomes red, warm, or painful START the Prednisone as directed.  3 pills at the same time x3 days and then 2 pills at the same time x3 days, and then 1 pill daily ICE!!!  20 minutes at a time, multiple times a day PROTECT the elbow and do not lean on it Call with any questions or concerns Stay Safe!  Stay Healthy!   Bursitis  Bursitis is when the fluid-filled sac (bursa) that covers and protects a joint is swollen (inflamed). Bursitis is most common near joints such as the knees, elbows, hips, and shoulders. It can cause pain and stiffness. Follow these instructions at home: Medicines  Take over-the-counter and prescription medicines only as told by your doctor.  If you were prescribed an antibiotic medicine, take it as told by your doctor. Do not stop taking it even if you start to feel better. General instructions   Rest the affected area as told by your doctor. ? If you can, raise (elevate) the affected area above the level of your heart while you are sitting or lying down. ? Avoid doing things that make the pain worse.  Use a splint, brace, pad, or walking aid as told by your doctor.  If directed, put ice on the affected area: ? If you have a removable splint or brace, take it off as told by your doctor. ? Put ice in a plastic bag. ? Place a towel between your skin and the bag, or between the splint or brace and the bag. ? Leave the ice on for 20 minutes, 2-3 times a day.  Keep all follow-up visits as told by your doctor. This is important. Preventing symptoms Do these things to help you not have symptoms again:  Wear knee pads if you kneel often.  Wear sturdy running or walking shoes that fit you well.  Take a lot of breaks during activities that involve doing the same movements again and again.  Before you do any activity that takes a lot of effort, get your body ready by  stretching.  Stay at a healthy weight or lose weight if your doctor says you should. If you need help doing this, ask your doctor.  Exercise often. If you start any new physical activity, do it slowly. Contact a doctor if you:  Have a fever.  Have chills.  Have symptoms that do not get better with treatment or home care. Summary  Bursitis is when the fluid-filled sac (bursa) that covers and protects a joint is swollen.  Rest the affected area as told by your doctor.  Avoid doing things that make the pain worse.  Put ice on the affected area as told by your doctor. This information is not intended to replace advice given to you by your health care provider. Make sure you discuss any questions you have with your health care provider. Document Revised: 08/27/2017 Document Reviewed: 07/30/2017 Elsevier Patient Education  2020 [employer].

## 2019-10-20 ENCOUNTER — Encounter: Payer: Self-pay | Admitting: Family Medicine

## 2019-11-09 DIAGNOSIS — N905 Atrophy of vulva: Secondary | ICD-10-CM | POA: Diagnosis not present

## 2019-11-09 DIAGNOSIS — N952 Postmenopausal atrophic vaginitis: Secondary | ICD-10-CM | POA: Diagnosis not present

## 2019-11-09 DIAGNOSIS — Z01419 Encounter for gynecological examination (general) (routine) without abnormal findings: Secondary | ICD-10-CM | POA: Diagnosis not present

## 2019-11-22 DIAGNOSIS — M25522 Pain in left elbow: Secondary | ICD-10-CM | POA: Diagnosis not present

## 2020-01-30 ENCOUNTER — Other Ambulatory Visit: Payer: Self-pay

## 2020-01-31 ENCOUNTER — Encounter: Payer: Self-pay | Admitting: Family Medicine

## 2020-01-31 ENCOUNTER — Ambulatory Visit: Payer: Medicare PPO | Admitting: Family Medicine

## 2020-01-31 VITALS — BP 124/84 | HR 67 | Temp 97.7°F | Resp 17 | Ht 66.0 in | Wt 159.2 lb

## 2020-01-31 DIAGNOSIS — E785 Hyperlipidemia, unspecified: Secondary | ICD-10-CM | POA: Diagnosis not present

## 2020-01-31 DIAGNOSIS — E663 Overweight: Secondary | ICD-10-CM | POA: Insufficient documentation

## 2020-01-31 LAB — HEPATIC FUNCTION PANEL
ALT: 13 U/L (ref 0–35)
AST: 17 U/L (ref 0–37)
Albumin: 4.4 g/dL (ref 3.5–5.2)
Alkaline Phosphatase: 75 U/L (ref 39–117)
Bilirubin, Direct: 0.1 mg/dL (ref 0.0–0.3)
Total Bilirubin: 0.5 mg/dL (ref 0.2–1.2)
Total Protein: 6.5 g/dL (ref 6.0–8.3)

## 2020-01-31 LAB — BASIC METABOLIC PANEL
BUN: 15 mg/dL (ref 6–23)
CO2: 32 mEq/L (ref 19–32)
Calcium: 9.7 mg/dL (ref 8.4–10.5)
Chloride: 106 mEq/L (ref 96–112)
Creatinine, Ser: 0.69 mg/dL (ref 0.40–1.20)
GFR: 83.73 mL/min (ref >60.00)
Glucose, Bld: 90 mg/dL (ref 70–99)
Potassium: 4.2 mEq/L (ref 3.5–5.1)
Sodium: 143 mEq/L (ref 135–145)

## 2020-01-31 LAB — LIPID PANEL
Cholesterol: 189 mg/dL (ref 0–200)
HDL: 39.6 mg/dL (ref >39.00)
LDL Cholesterol: 116 mg/dL — ABNORMAL HIGH (ref 0–99)
NonHDL: 149.09
Total CHOL/HDL Ratio: 5
Triglycerides: 166 mg/dL — ABNORMAL HIGH (ref 0.0–149.0)
VLDL: 33.2 mg/dL (ref 0.0–40.0)

## 2020-01-31 NOTE — Assessment & Plan Note (Signed)
Chronic problem.  Tolerating statin w/o difficulty.  Check labs.  Adjust meds prn  

## 2020-01-31 NOTE — Patient Instructions (Signed)
Schedule your complete physical in 6 months We'll notify you of your lab results and make any changes if needed Keep up the good work on healthy diet and regular exercise- you look great! Call with any questions or concerns Happy Mother's Day!!! 

## 2020-01-31 NOTE — Progress Notes (Signed)
   Subjective:    Patient ID: Kristen Huffman, female    DOB: [birth year], 74 y.o.   MRN: [id number]  HPI Hyperlipidemia- chronic problem, on Lipitor 10mg  daily.  Denies CP, SOB, abd pain, N/V, visual changes.  Overweight- pt is down 5 lbs since last visit.  Pt is exercising more regularly and eating less.   Review of Systems For ROS see HPI   This visit occurred during the SARS-CoV-2 public health emergency.  Safety protocols were in place, including screening questions prior to the visit, additional usage of staff PPE, and extensive cleaning of exam room while observing appropriate contact time as indicated for disinfecting solutions.       Objective:   Physical Exam Vitals reviewed.  Constitutional:      General: She is not in acute distress.    Appearance: Normal appearance. She is well-developed.  HENT:     Head: Normocephalic and atraumatic.  Eyes:     Conjunctiva/sclera: Conjunctivae normal.     Pupils: Pupils are equal, round, and reactive to light.  Neck:     Thyroid: No thyromegaly.  Cardiovascular:     Rate and Rhythm: Normal rate and regular rhythm.     Heart sounds: Normal heart sounds. No murmur.  Pulmonary:     Effort: Pulmonary effort is normal. No respiratory distress.     Breath sounds: Normal breath sounds.  Abdominal:     General: There is no distension.     Palpations: Abdomen is soft.     Tenderness: There is no abdominal tenderness.  Musculoskeletal:     Cervical back: Normal range of motion and neck supple.  Lymphadenopathy:     Cervical: No cervical adenopathy.  Skin:    General: Skin is warm and dry.  Neurological:     Mental Status: She is alert and oriented to person, place, and time.  Psychiatric:        Behavior: Behavior normal.           Assessment & Plan:

## 2020-01-31 NOTE — Assessment & Plan Note (Signed)
Applauded her recent weight loss efforts.  Will continue to follow.

## 2020-02-01 ENCOUNTER — Other Ambulatory Visit: Payer: Self-pay | Admitting: Family Medicine

## 2020-03-01 DIAGNOSIS — M13861 Other specified arthritis, right knee: Secondary | ICD-10-CM | POA: Diagnosis not present

## 2020-03-01 DIAGNOSIS — M25561 Pain in right knee: Secondary | ICD-10-CM | POA: Diagnosis not present

## 2020-03-12 DIAGNOSIS — M1711 Unilateral primary osteoarthritis, right knee: Secondary | ICD-10-CM | POA: Diagnosis not present

## 2020-05-06 DIAGNOSIS — E785 Hyperlipidemia, unspecified: Secondary | ICD-10-CM | POA: Diagnosis not present

## 2020-05-06 DIAGNOSIS — R030 Elevated blood-pressure reading, without diagnosis of hypertension: Secondary | ICD-10-CM | POA: Diagnosis not present

## 2020-05-06 DIAGNOSIS — Z7982 Long term (current) use of aspirin: Secondary | ICD-10-CM | POA: Diagnosis not present

## 2020-05-11 ENCOUNTER — Encounter: Payer: Self-pay | Admitting: Family Medicine

## 2020-05-11 ENCOUNTER — Encounter (HOSPITAL_BASED_OUTPATIENT_CLINIC_OR_DEPARTMENT_OTHER): Payer: Self-pay | Admitting: Emergency Medicine

## 2020-05-11 ENCOUNTER — Other Ambulatory Visit: Payer: Self-pay

## 2020-05-11 ENCOUNTER — Emergency Department (HOSPITAL_BASED_OUTPATIENT_CLINIC_OR_DEPARTMENT_OTHER)
Admission: EM | Admit: 2020-05-11 | Discharge: 2020-05-11 | Disposition: A | Discharge status: Home / Self Care | Payer: Medicare PPO | Attending: Emergency Medicine | Admitting: Emergency Medicine

## 2020-05-11 DIAGNOSIS — R269 Unspecified abnormalities of gait and mobility: Secondary | ICD-10-CM | POA: Insufficient documentation

## 2020-05-11 DIAGNOSIS — Z5321 Procedure and treatment not carried out due to patient leaving prior to being seen by health care provider: Secondary | ICD-10-CM | POA: Insufficient documentation

## 2020-05-11 DIAGNOSIS — N898 Other specified noninflammatory disorders of vagina: Secondary | ICD-10-CM | POA: Insufficient documentation

## 2020-05-11 DIAGNOSIS — R350 Frequency of micturition: Secondary | ICD-10-CM | POA: Insufficient documentation

## 2020-05-11 LAB — URINALYSIS, ROUTINE W REFLEX MICROSCOPIC
Bilirubin Urine: NEGATIVE
Glucose, UA: NEGATIVE mg/dL
Hgb urine dipstick: NEGATIVE
Ketones, ur: NEGATIVE mg/dL
Nitrite: NEGATIVE
Protein, ur: NEGATIVE mg/dL
Specific Gravity, Urine: <1.005 — ABNORMAL LOW (ref 1.005–1.030)
pH: 6.5 (ref 5.0–8.0)

## 2020-05-11 LAB — URINALYSIS, MICROSCOPIC (REFLEX): RBC / HPF: NONE SEEN RBC/hpf (ref 0–5)

## 2020-05-11 NOTE — ED Triage Notes (Signed)
Pt c/o urinary frequency and vaginal discharge x 2 days. Pt reports using AZO for 2 days with no relief. Pt ambulatory, normal gait, denies fever

## 2020-05-13 ENCOUNTER — Other Ambulatory Visit: Payer: Self-pay | Admitting: General Practice

## 2020-05-13 MED ORDER — SULFAMETHOXAZOLE-TRIMETHOPRIM 800-160 MG PO TABS: 1.0000 | ORAL_TABLET | Freq: Two times a day (BID) | ORAL | 0 refills | Status: DC

## 2020-05-13 NOTE — Telephone Encounter (Signed)
Patient called back and said that she needs to be able to pick this rx up by 10:00 am.  She has to carry her husband to a doctor's appointment.  Please Advise

## 2020-06-04 DIAGNOSIS — L578 Other skin changes due to chronic exposure to nonionizing radiation: Secondary | ICD-10-CM | POA: Diagnosis not present

## 2020-06-04 DIAGNOSIS — D225 Melanocytic nevi of trunk: Secondary | ICD-10-CM | POA: Diagnosis not present

## 2020-06-04 DIAGNOSIS — L820 Inflamed seborrheic keratosis: Secondary | ICD-10-CM | POA: Diagnosis not present

## 2020-06-04 DIAGNOSIS — L814 Other melanin hyperpigmentation: Secondary | ICD-10-CM | POA: Diagnosis not present

## 2020-06-04 DIAGNOSIS — L538 Other specified erythematous conditions: Secondary | ICD-10-CM | POA: Diagnosis not present

## 2020-06-04 DIAGNOSIS — X32XXXA Exposure to sunlight, initial encounter: Secondary | ICD-10-CM | POA: Diagnosis not present

## 2020-06-04 DIAGNOSIS — L821 Other seborrheic keratosis: Secondary | ICD-10-CM | POA: Diagnosis not present

## 2020-06-05 ENCOUNTER — Telehealth: Payer: Self-pay | Admitting: Family Medicine

## 2020-06-05 DIAGNOSIS — M810 Age-related osteoporosis without current pathological fracture: Secondary | ICD-10-CM

## 2020-06-05 DIAGNOSIS — Z1231 Encounter for screening mammogram for malignant neoplasm of breast: Secondary | ICD-10-CM

## 2020-06-05 NOTE — Telephone Encounter (Signed)
Ok for both

## 2020-06-05 NOTE — Telephone Encounter (Signed)
Ok for orders? 

## 2020-06-05 NOTE — Telephone Encounter (Signed)
Orders placed.

## 2020-06-05 NOTE — Telephone Encounter (Signed)
Pt called in asking if we can go ahead and place an order for a mammogram and a bone density. She would like to go to [facility].   Pt can be reached at the home #

## 2020-06-10 ENCOUNTER — Encounter: Payer: Self-pay | Admitting: Family Medicine

## 2020-06-24 ENCOUNTER — Encounter: Payer: Self-pay | Admitting: Family Medicine

## 2020-06-25 DIAGNOSIS — H25811 Combined forms of age-related cataract, right eye: Secondary | ICD-10-CM | POA: Diagnosis not present

## 2020-06-25 DIAGNOSIS — H2512 Age-related nuclear cataract, left eye: Secondary | ICD-10-CM | POA: Diagnosis not present

## 2020-06-25 DIAGNOSIS — H43813 Vitreous degeneration, bilateral: Secondary | ICD-10-CM | POA: Diagnosis not present

## 2020-06-25 DIAGNOSIS — H35721 Serous detachment of retinal pigment epithelium, right eye: Secondary | ICD-10-CM | POA: Diagnosis not present

## 2020-06-26 DIAGNOSIS — L03116 Cellulitis of left lower limb: Secondary | ICD-10-CM | POA: Diagnosis not present

## 2020-06-26 DIAGNOSIS — S91302A Unspecified open wound, left foot, initial encounter: Secondary | ICD-10-CM | POA: Diagnosis not present

## 2020-06-26 DIAGNOSIS — S81002A Unspecified open wound, left knee, initial encounter: Secondary | ICD-10-CM | POA: Diagnosis not present

## 2020-07-10 ENCOUNTER — Encounter: Payer: Self-pay | Admitting: Family Medicine

## 2020-08-01 ENCOUNTER — Other Ambulatory Visit: Payer: Self-pay | Admitting: Family Medicine

## 2020-08-05 ENCOUNTER — Ambulatory Visit (INDEPENDENT_AMBULATORY_CARE_PROVIDER_SITE_OTHER): Payer: Medicare PPO

## 2020-08-05 ENCOUNTER — Other Ambulatory Visit: Payer: Self-pay

## 2020-08-05 ENCOUNTER — Ambulatory Visit (HOSPITAL_BASED_OUTPATIENT_CLINIC_OR_DEPARTMENT_OTHER)
Admission: RE | Admit: 2020-08-05 | Discharge: 2020-08-05 | Disposition: A | Discharge status: Home / Self Care | Payer: Medicare PPO | Source: Ambulatory Visit | Attending: Family Medicine | Admitting: Family Medicine

## 2020-08-05 VITALS — Ht 66.0 in | Wt 152.0 lb

## 2020-08-05 DIAGNOSIS — Z1231 Encounter for screening mammogram for malignant neoplasm of breast: Secondary | ICD-10-CM | POA: Diagnosis not present

## 2020-08-05 DIAGNOSIS — Z0000 Encounter for general adult medical examination without abnormal findings: Secondary | ICD-10-CM | POA: Diagnosis not present

## 2020-08-05 DIAGNOSIS — E2839 Other primary ovarian failure: Secondary | ICD-10-CM | POA: Diagnosis not present

## 2020-08-05 DIAGNOSIS — M810 Age-related osteoporosis without current pathological fracture: Secondary | ICD-10-CM | POA: Diagnosis not present

## 2020-08-05 DIAGNOSIS — Z780 Asymptomatic menopausal state: Secondary | ICD-10-CM | POA: Diagnosis not present

## 2020-08-05 DIAGNOSIS — M85851 Other specified disorders of bone density and structure, right thigh: Secondary | ICD-10-CM | POA: Diagnosis not present

## 2020-08-05 NOTE — Progress Notes (Signed)
Subjective:   Kristen Huffman is a 74 y.o. female who presents for Medicare Annual (Subsequent) preventive examination.  I connected with Ramsha today by telephone and verified that I am speaking with the correct person using two identifiers. Location patient: home Location provider: work Persons participating in the virtual visit: patient, Engineer, civil (consulting).    I discussed the limitations, risks, security and privacy concerns of performing an evaluation and management service by telephone and the availability of in person appointments. I also discussed with the patient that there may be a patient responsible charge related to this service. The patient expressed understanding and verbally consented to this telephonic visit.    Interactive audio and video telecommunications were attempted between this provider and patient, however failed, due to patient having technical difficulties OR patient did not have access to video capability.  We continued and completed visit with audio only.  Some vital signs may be absent or patient reported.   Time Spent with patient on telephone encounter: 20 minutes  Review of Systems     Cardiac Risk Factors include: advanced age (>14men, >45 women);dyslipidemia     Objective:    Today's Vitals   08/05/20 1505  Weight: 152 lb (68.9 kg)  Height: 5\' 6"  (1.676 m)   Body mass index is 24.53 kg/m.  Advanced Directives 08/05/2020 05/11/2020 06/16/2017 04/11/2017 02/22/2015 02/21/2015 10/28/2014  Does Patient Have a Medical Advance Directive? Yes Yes Yes No Yes Yes No  Type of 10/30/2014 of [place];Living will - Living will;Healthcare Power of [place] - Healthcare Power of [place];Living will Healthcare Power of [place];Living will -  Does patient want to make changes to medical advance directive? - - - - No - Patient declined No - Patient declined -  Copy of Healthcare Power of Attorney in Chart? No - copy requested - No - copy requested - No - copy  requested - -    Current Medications (verified) Outpatient Encounter Medications as of 08/05/2020  Medication Sig  . aspirin 81 MG tablet Take 81 mg by mouth daily.  13/04/2020 atorvastatin (LIPITOR) 10 MG tablet TAKE 1 TABLET BY MOUTH EVERY DAY  . Calcium Carbonate (CALCIUM 600 PO) Take by mouth.  . cetirizine (ZYRTEC) 10 MG tablet Take 10 mg by mouth daily.  . Cyanocobalamin (VITAMIN B-12 PO) Take 200 mcg by mouth daily.  . Multiple Vitamin (MULTIVITAMIN) tablet Take 1 tablet by mouth daily.    . [DISCONTINUED] sulfamethoxazole-trimethoprim (BACTRIM DS) 800-160 MG tablet Take 1 tablet by mouth 2 (two) times daily.   No facility-administered encounter medications on file as of 08/05/2020.    Allergies (verified) Tetanus toxoids, Codeine, Novocain [procaine], and Penicillins   History: Past Medical History:  Diagnosis Date  . Dental crown present   . Extensor tenosynovitis of left wrist 01/2015  . History of hepatitis A   . Hyperlipidemia    Past Surgical History:  Procedure Laterality Date  . DORSAL COMPARTMENT RELEASE Left 02/22/2015   Procedure: RELEASE LEFT FIRST DORSAL COMPARTMENT (DEQUERVAIN) AND RADIAL TENOSYNOVECTOMY;  Surgeon: 02/24/2015, MD;  Location: [facility];  Service: Orthopedics;  Laterality: Left;  . EYE FOREIGN BODY REMOVAL    . INCONTINENCE SURGERY    . KNEE ARTHROSCOPY W/ MENISCAL REPAIR Right   . REPAIR ANKLE LIGAMENT Left   . TENDON REPAIR Left    wrist   Family History  Problem Relation Age of Onset  . Heart attack Father 7  . Hypertension Mother   . Heart  failure Mother    Social History   Socioeconomic History  . Marital status: Married    Spouse name: Not on file  . Number of children: 2  . Years of education: Not on file  . Highest education level: Not on file  Occupational History  . Not on file  Tobacco Use  . Smoking status: Former Smoker    Packs/day: 0.50    Years: 15.00    Pack years: 7.50    Types: Cigarettes     Quit date: 09/29/1979    Years since quitting: 40.8  . Smokeless tobacco: Never Used  Substance and Sexual Activity  . Alcohol use: No    Alcohol/week: 0.0 standard drinks  . Drug use: No  . Sexual activity: Not on file  Other Topics Concern  . Not on file  Social History Narrative  . Not on file   Social Determinants of Health   Financial Resource Strain: Low Risk   . Difficulty of Paying Living Expenses: Not hard at all  Food Insecurity: No Food Insecurity  . Worried About Programme researcher, broadcasting/film/video in the Last Year: Never true  . Ran Out of Food in the Last Year: Never true  Transportation Needs: No Transportation Needs  . Lack of Transportation (Medical): No  . Lack of Transportation (Non-Medical): No  Physical Activity: Insufficiently Active  . Days of Exercise per Week: 3 days  . Minutes of Exercise per Session: 40 min  Stress: No Stress Concern Present  . Feeling of Stress : Not at all  Social Connections: Moderately Integrated  . Frequency of Communication with Friends and Family: More than three times a week  . Frequency of Social Gatherings with Friends and Family: Never  . Attends Religious Services: 1 to 4 times per year  . Active Member of Clubs or Organizations: No  . Attends Banker Meetings: Never  . Marital Status: Married    Tobacco Counseling Counseling given: Not Answered   Clinical Intake:  Pre-visit preparation completed: Yes  Pain : No/denies pain     Nutritional Status: BMI of 19-24  Normal Nutritional Risks: None Diabetes: No  How often do you need to have someone help you when you read instructions, pamphlets, or other written materials from your doctor or pharmacy?: 1 - Never What is the last grade level you completed in school?: 1 yr of college  Diabetic?No  Interpreter Needed?: No  Information entered by :: Thomasenia Sales LPN   Activities of Daily Living In your present state of health, do you have any difficulty  performing the following activities: 08/05/2020 01/31/2020  Hearing? N N  Vision? N N  Difficulty concentrating or making decisions? N N  Walking or climbing stairs? N N  Dressing or bathing? N N  Doing errands, shopping? N N  Preparing Food and eating ? N -  Using the Toilet? N -  In the past six months, have you accidently leaked urine? N -  Do you have problems with loss of bowel control? N -  Managing your Medications? N -  Managing your Finances? N -  Housekeeping or managing your Housekeeping? N -  Some recent data might be hidden    Patient Care Team: Sheliah Hatch, MD as PCP - General (Family Medicine) Jeani Hawking, MD as Consulting Physician (Gastroenterology) Ola Spurr., MD as Consulting Physician (Obstetrics and Gynecology) Dominica Severin, MD as Consulting Physician (Orthopedic Surgery) Mat Carne, DO as Consulting Physician (Optometry) Puschinsky,  Adelfa Koh., MD as Consulting Physician (Urology) Leola Brazil (Dermatology)  Indicate any recent Medical Services you may have received from other than [facility] providers in the past year (date may be approximate).     Assessment:   This is a routine wellness examination for Amillya.  Hearing/Vision screen  Hearing Screening   125Hz  250Hz  500Hz  1000Hz  2000Hz  3000Hz  4000Hz  6000Hz  8000Hz   Right ear:           Left ear:           Comments: No issues  Vision Screening Comments: WEARS GLASSES LAST EYE EXAM-06/2020-DR. PARKER  Dietary issues and exercise activities discussed: Current Exercise Habits: Home exercise routine, Type of exercise: walking, Time (Minutes): 45, Frequency (Times/Week): 3, Weekly Exercise (Minutes/Week): 135, Intensity: Mild, Exercise limited by: None identified  Goals    . Patient Stated     Drink more water & increase activity      Depression Screen PHQ 2/9 Scores 08/05/2020 01/31/2020 10/11/2019 08/04/2019 08/04/2019 02/01/2019 07/29/2018  PHQ - 2 Score 0 0 0 0 0 0 0  PHQ- 9 Score - 0 0 0  0 - 0  Exception Documentation - - - - - - -    Fall Risk Fall Risk  08/05/2020 01/31/2020 10/11/2019 08/04/2019 08/04/2019  Falls in the past year? 0 0 0 0 0  Number falls in past yr: 0 0 0 0 0  Injury with Fall? 0 0 0 0 0  Follow up Falls prevention discussed Falls evaluation completed Falls evaluation completed Falls evaluation completed Falls evaluation completed    Any stairs in or around the home? No  Home free of loose throw rugs in walkways, pet beds, electrical cords, etc? Yes  Adequate lighting in your home to reduce risk of falls? Yes   ASSISTIVE DEVICES UTILIZED TO PREVENT FALLS:  Life alert? No  Use of a cane, walker or w/c? No  Grab bars in the bathroom? Yes  Shower chair or bench in shower? No  Elevated toilet seat or a handicapped toilet? No   TIMED UP AND GO:  Was the test performed? No . Phone visit   Cognitive Function:No cognitive impairment noted        Immunizations Immunization History  Administered Date(s) Administered  . Fluad Quad(high Dose 65+) 06/07/2019  . Influenza Split 09/03/2011  . Influenza, High Dose Seasonal PF 06/16/2017, 06/29/2018, 06/07/2019, 07/19/2020  . Influenza, Seasonal, Injecte, Preservative Fre 09/06/2012  . Influenza,inj,Quad PF,6+ Mos 09/25/2013, 05/28/2014, 06/10/2015, 06/10/2016, 06/29/2018  . Moderna SARS-COVID-2 Vaccination 10/19/2019, 11/17/2019, 07/20/2020  . Pneumococcal Conjugate-13 05/28/2014  . Pneumococcal Polysaccharide-23 06/10/2015  . Zoster 03/07/2012  . Zoster Recombinat (Shingrix) 06/16/2017, 09/08/2017    TDAP status: Patient is allergic  Flu Vaccine status: Up to date   Pneumococcal vaccine status: Up to date   Covid-19 vaccine status: Completed vaccines  Qualifies for Shingles Vaccine? No   Zostavax completed Yes   Shingrix Completed?: Yes  Screening Tests Health Maintenance  Topic Date Due  . Hepatitis C Screening  Never done  . MAMMOGRAM  07/31/2020  . TETANUS/TDAP  03/07/2022  .  COLONOSCOPY  09/29/2023  . INFLUENZA VACCINE  Completed  . DEXA SCAN  Completed  . COVID-19 Vaccine  Completed  . PNA vac Low Risk Adult  Completed    Health Maintenance  Health Maintenance Due  Topic Date Due  . Hepatitis C Screening  Never done  . MAMMOGRAM  07/31/2020    Colorectal cancer screening: Completed Colonoscopy 09/28/2013. Repeat every  10 years   Mammogram status: Scheduled for 09/09/2020  Bone Density status: Completed 08/05/2020. Results reflect: Bone density results: OSTEOPENIA. Repeat every 2 years.  Lung Cancer Screening: (Low Dose CT Chest recommended if Age 66-80 years, 30 pack-year currently smoking OR have quit w/in 15years.) does not qualify.     Additional Screening:  Hepatitis C Screening: does qualify; Discuss with PCP  Vision Screening: Recommended annual ophthalmology exams for early detection of glaucoma and other disorders of the eye. Is the patient up to date with their annual eye exam?  Yes  Who is the provider or what is the name of the office in which the patient attends annual eye exams? Dr. Jimmey RalphParker   Dental Screening: Recommended annual dental exams for proper oral hygiene  Community Resource Referral / Chronic Care Management: CRR required this visit?  No   CCM required this visit?  No      Plan:     I have personally reviewed and noted the following in the patient's chart:   . Medical and social history . Use of alcohol, tobacco or illicit drugs  . Current medications and supplements . Functional ability and status . Nutritional status . Physical activity . Advanced directives . List of other physicians . Hospitalizations, surgeries, and ER visits in previous 12 months . Vitals . Screenings to include cognitive, depression, and falls . Referrals and appointments  In addition, I have reviewed and discussed with patient certain preventive protocols, quality metrics, and best practice recommendations. A written personalized  care plan for preventive services as well as general preventive health recommendations were provided to patient.   Due to this being a telephonic visit, the after visit summary with patients personalized plan was offered to patient via mail or my-chart. Patient would like to access on my-chart.   Roanna RaiderMartha A Kalani Sthilaire, LPN   11/9/147811/04/2020  Nurse Health Advisor  Nurse Notes: None

## 2020-08-05 NOTE — Patient Instructions (Signed)
Kristen Huffman , Thank you for taking time to complete your Medicare Wellness Visit. I appreciate your ongoing commitment to your health goals. Please review the following plan we discussed and let me know if I can assist you in the future.   Screening recommendations/referrals: Colonoscopy: Completed 09/28/2013-Due 09/29/2023 Mammogram: Scheduled for 09/09/2020 Bone Density: Completed 08/05/2020-Due-08/18/2022 Recommended yearly ophthalmology/optometry visit for glaucoma screening and checkup Recommended yearly dental visit for hygiene and checkup  Vaccinations: Influenza vaccine: Up to date Pneumococcal vaccine: Completed vaccines Tdap vaccine: Allergic to vaccine Shingles vaccine: Completed vaccines   Covid-19:Completed vaccines  Advanced directives: Please bring a copy for your chart  Conditions/risks identified:See problem list  Next appointment: Follow up in one year for your annual wellness visit    Preventive Care 65 Years and Older, Female Preventive care refers to lifestyle choices and visits with your health care provider that can promote health and wellness. What does preventive care include?  A yearly physical exam. This is also called an annual well check.  Dental exams once or twice a year.  Routine eye exams. Ask your health care provider how often you should have your eyes checked.  Personal lifestyle choices, including:  Daily care of your teeth and gums.  Regular physical activity.  Eating a healthy diet.  Avoiding tobacco and drug use.  Limiting alcohol use.  Practicing safe sex.  Taking low-dose aspirin every day.  Taking vitamin and mineral supplements as recommended by your health care provider. What happens during an annual well check? The services and screenings done by your health care provider during your annual well check will depend on your age, overall health, lifestyle risk factors, and family history of disease. Counseling  Your health care  provider may ask you questions about your:  Alcohol use.  Tobacco use.  Drug use.  Emotional well-being.  Home and relationship well-being.  Sexual activity.  Eating habits.  History of falls.  Memory and ability to understand (cognition).  Work and work Astronomer.  Reproductive health. Screening  You may have the following tests or measurements:  Height, weight, and BMI.  Blood pressure.  Lipid and cholesterol levels. These may be checked every 5 years, or more frequently if you are over 7 years old.  Skin check.  Lung cancer screening. You may have this screening every year starting at age 53 if you have a 30-pack-year history of smoking and currently smoke or have quit within the past 15 years.  Fecal occult blood test (FOBT) of the stool. You may have this test every year starting at age 60.  Flexible sigmoidoscopy or colonoscopy. You may have a sigmoidoscopy every 5 years or a colonoscopy every 10 years starting at age 60.  Hepatitis C blood test.  Hepatitis B blood test.  Sexually transmitted disease (STD) testing.  Diabetes screening. This is done by checking your blood sugar (glucose) after you have not eaten for a while (fasting). You may have this done every 1-3 years.  Bone density scan. This is done to screen for osteoporosis. You may have this done starting at age 1.  Mammogram. This may be done every 1-2 years. Talk to your health care provider about how often you should have regular mammograms. Talk with your health care provider about your test results, treatment options, and if necessary, the need for more tests. Vaccines  Your health care provider may recommend certain vaccines, such as:  Influenza vaccine. This is recommended every year.  Tetanus, diphtheria, and acellular pertussis (  Tdap, Td) vaccine. You may need a Td booster every 10 years.  Zoster vaccine. You may need this after age 66.  Pneumococcal 13-valent conjugate (PCV13)  vaccine. One dose is recommended after age 19.  Pneumococcal polysaccharide (PPSV23) vaccine. One dose is recommended after age 43. Talk to your health care provider about which screenings and vaccines you need and how often you need them. This information is not intended to replace advice given to you by your health care provider. Make sure you discuss any questions you have with your health care provider. Document Released: 10/11/2015 Document Revised: 06/03/2016 Document Reviewed: 07/16/2015 Elsevier Interactive Patient Education  2017 [employer] Prevention in the Home Falls can cause injuries. They can happen to people of all ages. There are many things you can do to make your home safe and to help prevent falls. What can I do on the outside of my home?  Regularly fix the edges of walkways and driveways and fix any cracks.  Remove anything that might make you trip as you walk through a door, such as a raised step or threshold.  Trim any bushes or trees on the path to your home.  Use bright outdoor lighting.  Clear any walking paths of anything that might make someone trip, such as rocks or tools.  Regularly check to see if handrails are loose or broken. Make sure that both sides of any steps have handrails.  Any raised decks and porches should have guardrails on the edges.  Have any leaves, snow, or ice cleared regularly.  Use sand or salt on walking paths during winter.  Clean up any spills in your garage right away. This includes oil or grease spills. What can I do in the bathroom?  Use night lights.  Install grab bars by the toilet and in the tub and shower. Do not use towel bars as grab bars.  Use non-skid mats or decals in the tub or shower.  If you need to sit down in the shower, use a plastic, non-slip stool.  Keep the floor dry. Clean up any water that spills on the floor as soon as it happens.  Remove soap buildup in the tub or shower  regularly.  Attach bath mats securely with double-sided non-slip rug tape.  Do not have throw rugs and other things on the floor that can make you trip. What can I do in the bedroom?  Use night lights.  Make sure that you have a light by your bed that is easy to reach.  Do not use any sheets or blankets that are too big for your bed. They should not hang down onto the floor.  Have a firm chair that has side arms. You can use this for support while you get dressed.  Do not have throw rugs and other things on the floor that can make you trip. What can I do in the kitchen?  Clean up any spills right away.  Avoid walking on wet floors.  Keep items that you use a lot in easy-to-reach places.  If you need to reach something above you, use a strong step stool that has a grab bar.  Keep electrical cords out of the way.  Do not use floor polish or wax that makes floors slippery. If you must use wax, use non-skid floor wax.  Do not have throw rugs and other things on the floor that can make you trip. What can I do with my stairs?  Do  not leave any items on the stairs.  Make sure that there are handrails on both sides of the stairs and use them. Fix handrails that are broken or loose. Make sure that handrails are as long as the stairways.  Check any carpeting to make sure that it is firmly attached to the stairs. Fix any carpet that is loose or worn.  Avoid having throw rugs at the top or bottom of the stairs. If you do have throw rugs, attach them to the floor with carpet tape.  Make sure that you have a light switch at the top of the stairs and the bottom of the stairs. If you do not have them, ask someone to add them for you. What else can I do to help prevent falls?  Wear shoes that:  Do not have high heels.  Have rubber bottoms.  Are comfortable and fit you well.  Are closed at the toe. Do not wear sandals.  If you use a stepladder:  Make sure that it is fully  opened. Do not climb a closed stepladder.  Make sure that both sides of the stepladder are locked into place.  Ask someone to hold it for you, if possible.  Clearly mark and make sure that you can see:  Any grab bars or handrails.  First and last steps.  Where the edge of each step is.  Use tools that help you move around (mobility aids) if they are needed. These include:  Canes.  Walkers.  Scooters.  Crutches.  Turn on the lights when you go into a dark area. Replace any light bulbs as soon as they burn out.  Set up your furniture so you have a clear path. Avoid moving your furniture around.  If any of your floors are uneven, fix them.  If there are any pets around you, be aware of where they are.  Review your medicines with your doctor. Some medicines can make you feel dizzy. This can increase your chance of falling. Ask your doctor what other things that you can do to help prevent falls. This information is not intended to replace advice given to you by your health care provider. Make sure you discuss any questions you have with your health care provider. Document Released: 07/11/2009 Document Revised: 02/20/2016 Document Reviewed: 10/19/2014 Elsevier Interactive Patient Education  2017 [employer].

## 2020-08-08 ENCOUNTER — Other Ambulatory Visit: Payer: Self-pay

## 2020-08-08 ENCOUNTER — Ambulatory Visit (INDEPENDENT_AMBULATORY_CARE_PROVIDER_SITE_OTHER): Payer: Medicare PPO | Admitting: Family Medicine

## 2020-08-08 ENCOUNTER — Encounter: Payer: Self-pay | Admitting: Family Medicine

## 2020-08-08 VITALS — BP 120/78 | HR 56 | Temp 97.3°F | Resp 19 | Ht 65.0 in | Wt 154.2 lb

## 2020-08-08 DIAGNOSIS — Z0000 Encounter for general adult medical examination without abnormal findings: Secondary | ICD-10-CM

## 2020-08-08 DIAGNOSIS — E785 Hyperlipidemia, unspecified: Secondary | ICD-10-CM | POA: Diagnosis not present

## 2020-08-08 DIAGNOSIS — Z0189 Encounter for other specified special examinations: Secondary | ICD-10-CM | POA: Diagnosis not present

## 2020-08-08 DIAGNOSIS — Z1159 Encounter for screening for other viral diseases: Secondary | ICD-10-CM | POA: Diagnosis not present

## 2020-08-08 LAB — HEPATIC FUNCTION PANEL
ALT: 15 U/L (ref 0–35)
AST: 17 U/L (ref 0–37)
Albumin: 4.3 g/dL (ref 3.5–5.2)
Alkaline Phosphatase: 58 U/L (ref 39–117)
Bilirubin, Direct: 0.1 mg/dL (ref 0.0–0.3)
Total Bilirubin: 0.5 mg/dL (ref 0.2–1.2)
Total Protein: 6.5 g/dL (ref 6.0–8.3)

## 2020-08-08 LAB — CBC WITH DIFFERENTIAL/PLATELET
Basophils Absolute: 0.1 10*3/uL (ref 0.0–0.1)
Basophils Relative: 1.1 % (ref 0.0–3.0)
Eosinophils Absolute: 0.1 10*3/uL (ref 0.0–0.7)
Eosinophils Relative: 2.7 % (ref 0.0–5.0)
HCT: 38.6 % (ref 36.0–46.0)
Hemoglobin: 12.8 g/dL (ref 12.0–15.0)
Lymphocytes Relative: 38.6 % (ref 12.0–46.0)
Lymphs Abs: 1.9 10*3/uL (ref 0.7–4.0)
MCHC: 33 g/dL (ref 30.0–36.0)
MCV: 85.3 fl (ref 78.0–100.0)
Monocytes Absolute: 0.4 10*3/uL (ref 0.1–1.0)
Monocytes Relative: 8.6 % (ref 3.0–12.0)
Neutro Abs: 2.4 10*3/uL (ref 1.4–7.7)
Neutrophils Relative %: 49 % (ref 43.0–77.0)
Platelets: 186 10*3/uL (ref 150.0–400.0)
RBC: 4.53 Mil/uL (ref 3.87–5.11)
RDW: 13.9 % (ref 11.5–15.5)
WBC: 4.9 10*3/uL (ref 4.0–10.5)

## 2020-08-08 LAB — LIPID PANEL
Cholesterol: 182 mg/dL (ref 0–200)
HDL: 45 mg/dL (ref >39.00)
LDL Cholesterol: 107 mg/dL — ABNORMAL HIGH (ref 0–99)
NonHDL: 136.58
Total CHOL/HDL Ratio: 4
Triglycerides: 147 mg/dL (ref 0.0–149.0)
VLDL: 29.4 mg/dL (ref 0.0–40.0)

## 2020-08-08 LAB — BASIC METABOLIC PANEL
BUN: 17 mg/dL (ref 6–23)
CO2: 31 mEq/L (ref 19–32)
Calcium: 9.4 mg/dL (ref 8.4–10.5)
Chloride: 105 mEq/L (ref 96–112)
Creatinine, Ser: 0.66 mg/dL (ref 0.40–1.20)
GFR: 87.83 mL/min (ref >60.00)
Glucose, Bld: 81 mg/dL (ref 70–99)
Potassium: 4.2 mEq/L (ref 3.5–5.1)
Sodium: 141 mEq/L (ref 135–145)

## 2020-08-08 LAB — TSH: TSH: 2.14 u[IU]/mL (ref 0.35–4.50)

## 2020-08-08 NOTE — Assessment & Plan Note (Signed)
Chronic problem.  Tolerating statin w/o difficulty.  Check labs.  Adjust meds prn  

## 2020-08-08 NOTE — Progress Notes (Signed)
   Subjective:    Patient ID: Kristen Huffman, female    DOB: April 14, 1946, 74 y.o.   MRN: [id number]  HPI CPE- UTD on colonoscopy, DEXA.  Mammo scheduled.  UTD on COVID, flu, pneumonia vaccines.  Patient Care Team    Relationship Specialty Notifications Start End  Sheliah Hatch, MD PCP - General Family Medicine  11/13/11   Jeani Hawking, MD Consulting Physician Gastroenterology  06/07/15   Ola Spurr., MD Consulting Physician Obstetrics and Gynecology  06/07/15   Dominica Severin, MD Consulting Physician Orthopedic Surgery  06/07/15   Mat Carne, DO Consulting Physician Optometry  06/07/15   Puschinsky, Adelfa Koh., MD Consulting Physician Urology  06/10/15   Leola Brazil  Dermatology  06/16/17     Health Maintenance  Topic Date Due  . Hepatitis C Screening  Never done  . MAMMOGRAM  07/31/2020  . TETANUS/TDAP  03/07/2022  . COLONOSCOPY  09/29/2023  . INFLUENZA VACCINE  Completed  . DEXA SCAN  Completed  . COVID-19 Vaccine  Completed  . PNA vac Low Risk Adult  Completed      Review of Systems Patient reports no vision/ hearing changes, adenopathy,fever, weight change,  persistant/recurrent hoarseness , swallowing issues, chest pain, palpitations, edema, persistant/recurrent cough, hemoptysis, dyspnea (rest/exertional/paroxysmal nocturnal), gastrointestinal bleeding (melena, rectal bleeding), abdominal pain, significant heartburn, bowel changes, GU symptoms (dysuria, hematuria, incontinence), Gyn symptoms (abnormal  bleeding, pain),  syncope, focal weakness, memory loss, numbness & tingling, skin/hair/nail changes, abnormal bruising or bleeding, anxiety, or depression.   This visit occurred during the SARS-CoV-2 public health emergency.  Safety protocols were in place, including screening questions prior to the visit, additional usage of staff PPE, and extensive cleaning of exam room while observing appropriate contact time as indicated for disinfecting solutions.       Objective:    Physical Exam General Appearance:    Alert, cooperative, no distress, appears stated age  Head:    Normocephalic, without obvious abnormality, atraumatic  Eyes:    PERRL, conjunctiva/corneas clear, EOM's intact, fundi    benign, both eyes  Ears:    Normal TM's and external ear canals, both ears  Nose:   Deferred due to COVID  Throat:   Neck:   Supple, symmetrical, trachea midline, no adenopathy;    Thyroid: no enlargement/tenderness/nodules  Back:     Symmetric, no curvature, ROM normal, no CVA tenderness  Lungs:     Clear to auscultation bilaterally, respirations unlabored  Chest Wall:    No tenderness or deformity   Heart:    Regular rate and rhythm, S1 and S2 normal, no murmur, rub   or gallop  Breast Exam:    Deferred to mammo  Abdomen:     Soft, non-tender, bowel sounds active all four quadrants,    no masses, no organomegaly  Genitalia:    Deferred   Rectal:    Extremities:   Extremities normal, atraumatic, no cyanosis or edema  Pulses:   2+ and symmetric all extremities  Skin:   Skin color, texture, turgor normal, no rashes or lesions  Lymph nodes:   Cervical, supraclavicular, and axillary nodes normal  Neurologic:   CNII-XII intact, normal strength, sensation and reflexes    throughout          Assessment & Plan:

## 2020-08-08 NOTE — Assessment & Plan Note (Signed)
Pt's PE WNL.  UTD on colonoscopy, immunizations, mammo scheduled.  Check labs.  Anticipatory guidance provided.

## 2020-08-08 NOTE — Patient Instructions (Signed)
Follow up in 6 months to recheck cholesterol We'll notify you of your lab results and make any changes if needed Keep up the good work!  You look great!! Call with any questions or concerns Stay Safe!  Stay Healthy! Happy Holidays!!! 

## 2020-08-09 LAB — HEPATITIS C ANTIBODY
Hepatitis C Ab: NONREACTIVE
SIGNAL TO CUT-OFF: 0 (ref <1.00)

## 2020-09-09 ENCOUNTER — Ambulatory Visit (HOSPITAL_BASED_OUTPATIENT_CLINIC_OR_DEPARTMENT_OTHER)
Admission: RE | Admit: 2020-09-09 | Discharge: 2020-09-09 | Disposition: A | Discharge status: Home / Self Care | Payer: Medicare PPO | Source: Ambulatory Visit | Attending: Family Medicine | Admitting: Family Medicine

## 2020-09-09 ENCOUNTER — Encounter (HOSPITAL_BASED_OUTPATIENT_CLINIC_OR_DEPARTMENT_OTHER): Payer: Self-pay

## 2020-09-09 ENCOUNTER — Other Ambulatory Visit: Payer: Self-pay

## 2020-09-09 DIAGNOSIS — Z1231 Encounter for screening mammogram for malignant neoplasm of breast: Secondary | ICD-10-CM | POA: Insufficient documentation

## 2020-10-10 ENCOUNTER — Telehealth: Payer: Self-pay

## 2020-10-10 NOTE — Telephone Encounter (Signed)
Patient had denial for DOS 08/08/2020.  Patient's last CPE was over a year ago.  Patient paid bill.  Billing will resubmit claim.  I have informed patient.

## 2020-10-22 DIAGNOSIS — M79605 Pain in left leg: Secondary | ICD-10-CM | POA: Diagnosis not present

## 2020-10-22 DIAGNOSIS — M79604 Pain in right leg: Secondary | ICD-10-CM | POA: Diagnosis not present

## 2020-11-12 DIAGNOSIS — I8312 Varicose veins of left lower extremity with inflammation: Secondary | ICD-10-CM | POA: Diagnosis not present

## 2020-11-12 DIAGNOSIS — I8311 Varicose veins of right lower extremity with inflammation: Secondary | ICD-10-CM | POA: Diagnosis not present

## 2020-11-19 DIAGNOSIS — M79605 Pain in left leg: Secondary | ICD-10-CM | POA: Diagnosis not present

## 2020-12-09 DIAGNOSIS — M13812 Other specified arthritis, left shoulder: Secondary | ICD-10-CM | POA: Diagnosis not present

## 2020-12-09 DIAGNOSIS — M7542 Impingement syndrome of left shoulder: Secondary | ICD-10-CM | POA: Diagnosis not present

## 2021-01-30 ENCOUNTER — Ambulatory Visit: Payer: Medicare PPO | Admitting: Family Medicine

## 2021-01-31 ENCOUNTER — Ambulatory Visit: Payer: Medicare PPO | Admitting: Family Medicine

## 2021-01-31 ENCOUNTER — Encounter: Payer: Self-pay | Admitting: Family Medicine

## 2021-01-31 ENCOUNTER — Other Ambulatory Visit: Payer: Self-pay

## 2021-01-31 VITALS — BP 126/80 | HR 56 | Temp 97.3°F | Resp 19 | Ht 65.0 in | Wt 157.4 lb

## 2021-01-31 DIAGNOSIS — E663 Overweight: Secondary | ICD-10-CM | POA: Diagnosis not present

## 2021-01-31 DIAGNOSIS — E785 Hyperlipidemia, unspecified: Secondary | ICD-10-CM

## 2021-01-31 LAB — HEPATIC FUNCTION PANEL
ALT: 15 U/L (ref 0–35)
AST: 18 U/L (ref 0–37)
Albumin: 4.3 g/dL (ref 3.5–5.2)
Alkaline Phosphatase: 59 U/L (ref 39–117)
Bilirubin, Direct: 0.1 mg/dL (ref 0.0–0.3)
Total Bilirubin: 0.6 mg/dL (ref 0.2–1.2)
Total Protein: 6.4 g/dL (ref 6.0–8.3)

## 2021-01-31 LAB — CBC WITH DIFFERENTIAL/PLATELET
Basophils Absolute: 0.1 10*3/uL (ref 0.0–0.1)
Basophils Relative: 1.2 % (ref 0.0–3.0)
Eosinophils Absolute: 0.1 10*3/uL (ref 0.0–0.7)
Eosinophils Relative: 1.5 % (ref 0.0–5.0)
HCT: 39.2 % (ref 36.0–46.0)
Hemoglobin: 13 g/dL (ref 12.0–15.0)
Lymphocytes Relative: 33.6 % (ref 12.0–46.0)
Lymphs Abs: 1.7 10*3/uL (ref 0.7–4.0)
MCHC: 33.1 g/dL (ref 30.0–36.0)
MCV: 86.1 fl (ref 78.0–100.0)
Monocytes Absolute: 0.5 10*3/uL (ref 0.1–1.0)
Monocytes Relative: 9.4 % (ref 3.0–12.0)
Neutro Abs: 2.7 10*3/uL (ref 1.4–7.7)
Neutrophils Relative %: 54.3 % (ref 43.0–77.0)
Platelets: 172 10*3/uL (ref 150.0–400.0)
RBC: 4.55 Mil/uL (ref 3.87–5.11)
RDW: 14.6 % (ref 11.5–15.5)
WBC: 4.9 10*3/uL (ref 4.0–10.5)

## 2021-01-31 LAB — BASIC METABOLIC PANEL
BUN: 13 mg/dL (ref 6–23)
CO2: 29 mEq/L (ref 19–32)
Calcium: 9.5 mg/dL (ref 8.4–10.5)
Chloride: 106 mEq/L (ref 96–112)
Creatinine, Ser: 0.7 mg/dL (ref 0.40–1.20)
GFR: 86.3 mL/min (ref >60.00)
Glucose, Bld: 83 mg/dL (ref 70–99)
Potassium: 4.4 mEq/L (ref 3.5–5.1)
Sodium: 143 mEq/L (ref 135–145)

## 2021-01-31 LAB — LIPID PANEL
Cholesterol: 168 mg/dL (ref 0–200)
HDL: 49.4 mg/dL (ref >39.00)
LDL Cholesterol: 97 mg/dL (ref 0–99)
NonHDL: 118.19
Total CHOL/HDL Ratio: 3
Triglycerides: 104 mg/dL (ref 0.0–149.0)
VLDL: 20.8 mg/dL (ref 0.0–40.0)

## 2021-01-31 LAB — TSH: TSH: 2.26 u[IU]/mL (ref 0.35–4.50)

## 2021-01-31 NOTE — Assessment & Plan Note (Signed)
Chronic problem.  On Lipitor 10mg daily w/o difficulty.  Check labs.  Adjust meds prn  

## 2021-01-31 NOTE — Progress Notes (Signed)
   Subjective:    Patient ID: Kristen Huffman, female    DOB: 1946-08-26, 75 y.o.   MRN: [id number]  HPI Hyperlipidemia- chronic problem, on Lipitor 10mg  daily.  No CP, SOB, HAs, visual changes, abd pain, N/V.  Overweight- pt has gained 3 lbs since last visit.  BMI is 26.19.  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  Safety protocols were in place, including screening questions prior to the visit, additional usage of staff PPE, and extensive cleaning of exam room while observing appropriate contact time as indicated for disinfecting solutions.       Objective:   Physical Exam Vitals reviewed.  Constitutional:      General: She is not in acute distress.    Appearance: Normal appearance. She is well-developed. She is not ill-appearing.  HENT:     Head: Normocephalic and atraumatic.  Eyes:     Conjunctiva/sclera: Conjunctivae normal.     Pupils: Pupils are equal, round, and reactive to light.  Neck:     Thyroid: No thyromegaly.  Cardiovascular:     Rate and Rhythm: Normal rate and regular rhythm.     Pulses: Normal pulses.     Heart sounds: Normal heart sounds. No murmur heard.   Pulmonary:     Effort: Pulmonary effort is normal. No respiratory distress.     Breath sounds: Normal breath sounds.  Abdominal:     General: There is no distension.     Palpations: Abdomen is soft.     Tenderness: There is no abdominal tenderness.  Musculoskeletal:     Cervical back: Normal range of motion and neck supple.     Right lower leg: No edema.     Left lower leg: No edema.  Lymphadenopathy:     Cervical: No cervical adenopathy.  Skin:    General: Skin is warm and dry.  Neurological:     Mental Status: She is alert and oriented to person, place, and time.  Psychiatric:        Behavior: Behavior normal.           Assessment & Plan:

## 2021-01-31 NOTE — Assessment & Plan Note (Signed)
Ongoing issue for pt.  She admits to very little self care- including diet and exercise- since her husband's lung cancer dx.  Encouraged her to take care of herself during this process.

## 2021-01-31 NOTE — Patient Instructions (Signed)
Schedule your complete physical in 6 months We'll notify you of your lab results and make any changes if needed Make sure you are taking time for you!!! Call with any questions or concerns Happy Mother's Day!!!

## 2021-02-06 ENCOUNTER — Other Ambulatory Visit: Payer: Self-pay | Admitting: Family Medicine

## 2021-02-07 ENCOUNTER — Telehealth: Payer: Self-pay | Admitting: Family Medicine

## 2021-02-07 MED ORDER — ALPRAZOLAM 0.5 MG PO TABS: 0.5000 mg | ORAL_TABLET | Freq: Two times a day (BID) | ORAL | 1 refills | Status: DC | PRN

## 2021-02-07 NOTE — Telephone Encounter (Signed)
Oh my!  With an INR of 6, no wonder there is trace blood in the urine.  We can cancel the urology referral as we have our answer.  As for the xanax, I'm happy to send some in.  They will be at the pharmacy to pick up.

## 2021-02-07 NOTE — Telephone Encounter (Signed)
Please see if you can get any more information from her regarding her questions or concerns.  She can also message me in MyChart if this is more comfortable for her.

## 2021-02-07 NOTE — Telephone Encounter (Signed)
Called and spoke with patient about concerns. Patient stated that she wanted you to know that her husband INR is a 6 and that blood can spill over if it is not in normal range. Also she stated that per you told her to let you know if she needed anything and she does need some xanax. It would be a new Rx. She just need something to help her with everything that is going on.

## 2021-02-07 NOTE — Telephone Encounter (Signed)
Patient would like to talk to Dr. Beverely Low about her husbands last visit.  Please advise

## 2021-03-26 ENCOUNTER — Encounter: Payer: Self-pay | Admitting: *Deleted

## 2021-03-28 DIAGNOSIS — H60541 Acute eczematoid otitis externa, right ear: Secondary | ICD-10-CM | POA: Diagnosis not present

## 2021-03-28 DIAGNOSIS — H6121 Impacted cerumen, right ear: Secondary | ICD-10-CM | POA: Diagnosis not present

## 2021-08-01 ENCOUNTER — Other Ambulatory Visit (HOSPITAL_BASED_OUTPATIENT_CLINIC_OR_DEPARTMENT_OTHER): Payer: Self-pay | Admitting: Family Medicine

## 2021-08-01 DIAGNOSIS — Z1231 Encounter for screening mammogram for malignant neoplasm of breast: Secondary | ICD-10-CM

## 2021-08-06 ENCOUNTER — Other Ambulatory Visit: Payer: Self-pay | Admitting: Family Medicine

## 2021-08-15 DIAGNOSIS — H5203 Hypermetropia, bilateral: Secondary | ICD-10-CM | POA: Diagnosis not present

## 2021-08-15 DIAGNOSIS — H524 Presbyopia: Secondary | ICD-10-CM | POA: Diagnosis not present

## 2021-08-15 DIAGNOSIS — H52222 Regular astigmatism, left eye: Secondary | ICD-10-CM | POA: Diagnosis not present

## 2021-09-04 ENCOUNTER — Ambulatory Visit (INDEPENDENT_AMBULATORY_CARE_PROVIDER_SITE_OTHER): Payer: Medicare PPO

## 2021-09-04 DIAGNOSIS — Z0000 Encounter for general adult medical examination without abnormal findings: Secondary | ICD-10-CM

## 2021-09-04 NOTE — Patient Instructions (Signed)
Kristen Huffman , Thank you for taking time to come for your Medicare Wellness Visit. I appreciate your ongoing commitment to your health goals. Please review the following plan we discussed and let me know if I can assist you in the future.   Screening recommendations/referrals: Colonoscopy: 09/28/2013  due 2025  Mammogram: 09/27/2020 Bone Density: 08/05/2020 Recommended yearly ophthalmology/optometry visit for glaucoma screening and checkup Recommended yearly dental visit for hygiene and checkup  Vaccinations: Influenza vaccine: completed  Pneumococcal vaccine: completed  Tdap vaccine: allergy  Shingles vaccine: completed     Advanced directives: none   Conditions/risks identified: none   Next appointment: none    Preventive Care 65 Years and Older, Female Preventive care refers to lifestyle choices and visits with your health care provider that can promote health and wellness. What does preventive care include? A yearly physical exam. This is also called an annual well check. Dental exams once or twice a year. Routine eye exams. Ask your health care provider how often you should have your eyes checked. Personal lifestyle choices, including: Daily care of your teeth and gums. Regular physical activity. Eating a healthy diet. Avoiding tobacco and drug use. Limiting alcohol use. Practicing safe sex. Taking low-dose aspirin every day. Taking vitamin and mineral supplements as recommended by your health care provider. What happens during an annual well check? The services and screenings done by your health care provider during your annual well check will depend on your age, overall health, lifestyle risk factors, and family history of disease. Counseling  Your health care provider may ask you questions about your: Alcohol use. Tobacco use. Drug use. Emotional well-being. Home and relationship well-being. Sexual activity. Eating habits. History of falls. Memory and ability to  understand (cognition). Work and work Astronomer. Reproductive health. Screening  You may have the following tests or measurements: Height, weight, and BMI. Blood pressure. Lipid and cholesterol levels. These may be checked every 5 years, or more frequently if you are over 32 years old. Skin check. Lung cancer screening. You may have this screening every year starting at age 25 if you have a 30-pack-year history of smoking and currently smoke or have quit within the past 15 years. Fecal occult blood test (FOBT) of the stool. You may have this test every year starting at age 13. Flexible sigmoidoscopy or colonoscopy. You may have a sigmoidoscopy every 5 years or a colonoscopy every 10 years starting at age 87. Hepatitis C blood test. Hepatitis B blood test. Sexually transmitted disease (STD) testing. Diabetes screening. This is done by checking your blood sugar (glucose) after you have not eaten for a while (fasting). You may have this done every 1-3 years. Bone density scan. This is done to screen for osteoporosis. You may have this done starting at age 80. Mammogram. This may be done every 1-2 years. Talk to your health care provider about how often you should have regular mammograms. Talk with your health care provider about your test results, treatment options, and if necessary, the need for more tests. Vaccines  Your health care provider may recommend certain vaccines, such as: Influenza vaccine. This is recommended every year. Tetanus, diphtheria, and acellular pertussis (Tdap, Td) vaccine. You may need a Td booster every 10 years. Zoster vaccine. You may need this after age 68. Pneumococcal 13-valent conjugate (PCV13) vaccine. One dose is recommended after age 42. Pneumococcal polysaccharide (PPSV23) vaccine. One dose is recommended after age 89. Talk to your health care provider about which screenings and vaccines you  need and how often you need them. This information is not  intended to replace advice given to you by your health care provider. Make sure you discuss any questions you have with your health care provider. Document Released: 10/11/2015 Document Revised: 06/03/2016 Document Reviewed: 07/16/2015 Elsevier Interactive Patient Education  2017 [place] Prevention in the Home Falls can cause injuries. They can happen to people of all ages. There are many things you can do to make your home safe and to help prevent falls. What can I do on the outside of my home? Regularly fix the edges of walkways and driveways and fix any cracks. Remove anything that might make you trip as you walk through a door, such as a raised step or threshold. Trim any bushes or trees on the path to your home. Use bright outdoor lighting. Clear any walking paths of anything that might make someone trip, such as rocks or tools. Regularly check to see if handrails are loose or broken. Make sure that both sides of any steps have handrails. Any raised decks and porches should have guardrails on the edges. Have any leaves, snow, or ice cleared regularly. Use sand or salt on walking paths during winter. Clean up any spills in your garage right away. This includes oil or grease spills. What can I do in the bathroom? Use night lights. Install grab bars by the toilet and in the tub and shower. Do not use towel bars as grab bars. Use non-skid mats or decals in the tub or shower. If you need to sit down in the shower, use a plastic, non-slip stool. Keep the floor dry. Clean up any water that spills on the floor as soon as it happens. Remove soap buildup in the tub or shower regularly. Attach bath mats securely with double-sided non-slip rug tape. Do not have throw rugs and other things on the floor that can make you trip. What can I do in the bedroom? Use night lights. Make sure that you have a light by your bed that is easy to reach. Do not use any sheets or blankets that are  too big for your bed. They should not hang down onto the floor. Have a firm chair that has side arms. You can use this for support while you get dressed. Do not have throw rugs and other things on the floor that can make you trip. What can I do in the kitchen? Clean up any spills right away. Avoid walking on wet floors. Keep items that you use a lot in easy-to-reach places. If you need to reach something above you, use a strong step stool that has a grab bar. Keep electrical cords out of the way. Do not use floor polish or wax that makes floors slippery. If you must use wax, use non-skid floor wax. Do not have throw rugs and other things on the floor that can make you trip. What can I do with my stairs? Do not leave any items on the stairs. Make sure that there are handrails on both sides of the stairs and use them. Fix handrails that are broken or loose. Make sure that handrails are as long as the stairways. Check any carpeting to make sure that it is firmly attached to the stairs. Fix any carpet that is loose or worn. Avoid having throw rugs at the top or bottom of the stairs. If you do have throw rugs, attach them to the floor with carpet tape. Make sure that  you have a light switch at the top of the stairs and the bottom of the stairs. If you do not have them, ask someone to add them for you. What else can I do to help prevent falls? Wear shoes that: Do not have high heels. Have rubber bottoms. Are comfortable and fit you well. Are closed at the toe. Do not wear sandals. If you use a stepladder: Make sure that it is fully opened. Do not climb a closed stepladder. Make sure that both sides of the stepladder are locked into place. Ask someone to hold it for you, if possible. Clearly mark and make sure that you can see: Any grab bars or handrails. First and last steps. Where the edge of each step is. Use tools that help you move around (mobility aids) if they are needed. These  include: Canes. Walkers. Scooters. Crutches. Turn on the lights when you go into a dark area. Replace any light bulbs as soon as they burn out. Set up your furniture so you have a clear path. Avoid moving your furniture around. If any of your floors are uneven, fix them. If there are any pets around you, be aware of where they are. Review your medicines with your doctor. Some medicines can make you feel dizzy. This can increase your chance of falling. Ask your doctor what other things that you can do to help prevent falls. This information is not intended to replace advice given to you by your health care provider. Make sure you discuss any questions you have with your health care provider. Document Released: 07/11/2009 Document Revised: 02/20/2016 Document Reviewed: 10/19/2014 Elsevier Interactive Patient Education  2017 [employer].

## 2021-09-04 NOTE — Progress Notes (Signed)
Subjective:   Kristen Huffman is a 75 y.o. female who presents for Medicare Annual (Subsequent) preventive examination.  I connected with Kristen Huffman today by telephone and verified that I am speaking with the correct person using two identifiers. Location patient: home Location provider: work Persons participating in the virtual visit: patient, provider.   I discussed the limitations, risks, security and privacy concerns of performing an evaluation and management service by telephone and the availability of in person appointments. I also discussed with the patient that there may be a patient responsible charge related to this service. The patient expressed understanding and verbally consented to this telephonic visit.    Interactive audio and video telecommunications were attempted between this provider and patient, however failed, due to patient having technical difficulties OR patient did not have access to video capability.  We continued and completed visit with audio only.    Review of Systems     Cardiac Risk Factors include: advanced age (>67men, >4 women)     Objective:    Today's Vitals   There is no height or weight on file to calculate BMI.  Advanced Directives 09/04/2021 08/05/2020 05/11/2020 06/16/2017 04/11/2017 02/22/2015 02/21/2015  Does Patient Have a Medical Advance Directive? Yes Yes Yes Yes No Yes Yes  Type of Estate agent of [place];Living will Healthcare Power of [place];Living will - Living will;Healthcare Power of [employer] - Healthcare Power of [place];Living will Healthcare Power of [place];Living will  Does patient want to make changes to medical advance directive? - - - - - No - Patient declined No - Patient declined  Copy of Healthcare Power of Attorney in Chart? No - copy requested No - copy requested - No - copy requested - No - copy requested -    Current Medications (verified) Outpatient Encounter Medications as of 09/04/2021   Medication Sig   ALPRAZolam (XANAX) 0.5 MG tablet Take 1 tablet (0.5 mg total) by mouth 2 (two) times daily as needed for anxiety.   atorvastatin (LIPITOR) 10 MG tablet TAKE 1 TABLET BY MOUTH EVERY DAY   cetirizine (ZYRTEC) 10 MG tablet Take 10 mg by mouth daily.   Cyanocobalamin (VITAMIN B-12 PO) Take 200 mcg by mouth daily.   Multiple Vitamin (MULTIVITAMIN) tablet Take 1 tablet by mouth daily.   aspirin 81 MG tablet Take 81 mg by mouth daily.   Calcium Carbonate (CALCIUM 600 PO) Take by mouth.   No facility-administered encounter medications on file as of 09/04/2021.    Allergies (verified) Tetanus toxoids, Codeine, Novocain [procaine], and Penicillins   History: Past Medical History:  Diagnosis Date   Dental crown present    Extensor tenosynovitis of left wrist 01/2015   History of hepatitis A    Hyperlipidemia    Past Surgical History:  Procedure Laterality Date   DORSAL COMPARTMENT RELEASE Left 02/22/2015   Procedure: RELEASE LEFT FIRST DORSAL COMPARTMENT (DEQUERVAIN) AND RADIAL TENOSYNOVECTOMY;  Surgeon: Dominica Severin, MD;  Location: [facility];  Service: Orthopedics;  Laterality: Left;   EYE FOREIGN BODY REMOVAL     INCONTINENCE SURGERY     KNEE ARTHROSCOPY W/ MENISCAL REPAIR Right    REPAIR ANKLE LIGAMENT Left    TENDON REPAIR Left    wrist   Family History  Problem Relation Age of Onset   Heart attack Father 15   Hypertension Mother    Heart failure Mother    Social History   Socioeconomic History   Marital status: Widowed    Spouse  name: Not on file   Number of children: 2   Years of education: Not on file   Highest education level: Not on file  Occupational History   Not on file  Tobacco Use   Smoking status: Former    Packs/day: 0.50    Years: 15.00    Pack years: 7.50    Types: Cigarettes    Quit date: 09/29/1979    Years since quitting: 41.9   Smokeless tobacco: Never  Substance and Sexual Activity   Alcohol use: No     Alcohol/week: 0.0 standard drinks   Drug use: No   Sexual activity: Not on file  Other Topics Concern   Not on file  Social History Narrative   Not on file   Social Determinants of Health   Financial Resource Strain: Low Risk    Difficulty of Paying Living Expenses: Not hard at all  Food Insecurity: No Food Insecurity   Worried About Programme researcher, broadcasting/film/video in the Last Year: Never true   Ran Out of Food in the Last Year: Never true  Transportation Needs: No Transportation Needs   Lack of Transportation (Medical): No   Lack of Transportation (Non-Medical): No  Physical Activity: Insufficiently Active   Days of Exercise per Week: 2 days   Minutes of Exercise per Session: 20 min  Stress: No Stress Concern Present   Feeling of Stress : Only a little  Social Connections: Moderately Isolated   Frequency of Communication with Friends and Family: Twice a week   Frequency of Social Gatherings with Friends and Family: Twice a week   Attends Religious Services: More than 4 times per year   Active Member of [employer] or Organizations: No   Attends Banker Meetings: Never   Marital Status: Widowed    Tobacco Counseling Counseling given: Not Answered   Clinical Intake:  Pre-visit preparation completed: Yes  Pain : No/denies pain     Nutritional Risks: None Diabetes: No  How often do you need to have someone help you when you read instructions, pamphlets, or other written materials from your doctor or pharmacy?: 1 - Never What is the last grade level you completed in school?: college  Diabetic?no   Interpreter Needed?: No  Information entered by :: L.S[place],LPn   Activities of Daily Living In your present state of health, do you have any difficulty performing the following activities: 09/04/2021 01/31/2021  Hearing? N N  Vision? N N  Difficulty concentrating or making decisions? N N  Walking or climbing stairs? N N  Dressing or bathing? N N  Doing errands, shopping?  N N  Preparing Food and eating ? N -  Using the Toilet? N -  In the past six months, have you accidently leaked urine? N -  Do you have problems with loss of bowel control? N -  Managing your Medications? N -  Managing your Finances? N -  Housekeeping or managing your Housekeeping? N -  Some recent data might be hidden    Patient Care Team: Sheliah Hatch, MD as PCP - General (Family Medicine) Jeani Hawking, MD as Consulting Physician (Gastroenterology) Ola Spurr., MD as Consulting Physician (Obstetrics and Gynecology) Dominica Severin, MD as Consulting Physician (Orthopedic Surgery) Mat Carne, DO as Consulting Physician (Optometry) Puschinsky, Adelfa Koh., MD as Consulting Physician (Urology) Leola Brazil (Dermatology)  Indicate any recent Medical Services you may have received from other than [facility] providers in the past year (date may be approximate).  Assessment:   This is a routine wellness examination for Kristen Huffman.  Hearing/Vision screen Vision Screening - Comments:: Annual eye exams wears glasses   Dietary issues and exercise activities discussed: Current Exercise Habits: Home exercise routine, Type of exercise: walking, Time (Minutes): 20, Frequency (Times/Week): 2, Weekly Exercise (Minutes/Week): 40, Intensity: Mild, Exercise limited by: None identified   Goals Addressed             This Visit's Progress    Patient Stated   On track    Drink more water & increase activity       Depression Screen PHQ 2/9 Scores 09/04/2021 09/04/2021 01/31/2021 08/08/2020 08/05/2020 01/31/2020 10/11/2019  PHQ - 2 Score 0 0 0 0 0 0 0  PHQ- 9 Score - - 0 0 - 0 0  Exception Documentation - - - - - - -    Fall Risk Fall Risk  09/04/2021 01/31/2021 08/08/2020 08/05/2020 01/31/2020  Falls in the past year? 0 0 0 0 0  Number falls in past yr: 0 0 0 0 0  Injury with Fall? 0 0 0 0 0  Risk for fall due to : - No Fall Risks No Fall Risks - -  Follow up Falls evaluation completed - -  Falls prevention discussed Falls evaluation completed    FALL RISK PREVENTION PERTAINING TO THE HOME:  Any stairs in or around the home? No  If so, are there any without handrails? No  Home free of loose throw rugs in walkways, pet beds, electrical cords, etc? Yes  Adequate lighting in your home to reduce risk of falls? Yes   ASSISTIVE DEVICES UTILIZED TO PREVENT FALLS:  Life alert? No  Use of a cane, walker or w/c? No  Grab bars in the bathroom? Yes  Shower chair or bench in shower? No  Elevated toilet seat or a handicapped toilet? No    Cognitive Function:    Normal cognitive status assessed by direct observation by this Nurse Health Advisor. No abnormalities found. '    Immunizations Immunization History  Administered Date(s) Administered   Fluad Quad(high Dose 65+) 06/07/2019   Influenza Split 09/03/2011   Influenza, High Dose Seasonal PF 06/16/2017, 06/29/2018, 06/07/2019, 07/19/2020, 08/12/2021   Influenza, Seasonal, Injecte, Preservative Fre 09/06/2012   Influenza,inj,Quad PF,6+ Mos 09/25/2013, 05/28/2014, 06/10/2015, 06/10/2016, 06/29/2018   Moderna Sars-Covid-2 Vaccination 10/19/2019, 11/17/2019, 07/20/2020   Pneumococcal Conjugate-13 05/28/2014   Pneumococcal Polysaccharide-23 06/10/2015   Zoster Recombinat (Shingrix) 06/16/2017, 09/08/2017   Zoster, Live 03/07/2012    TDAP status: Due, Education has been provided regarding the importance of this vaccine. Advised may receive this vaccine at local pharmacy or Health Dept. Aware to provide a copy of the vaccination record if obtained from local pharmacy or Health Dept. Verbalized acceptance and understanding.  Flu Vaccine status: Up to date  Pneumococcal vaccine status: Up to date  Covid-19 vaccine status: Completed vaccines  Qualifies for Shingles Vaccine? Yes   Zostavax completed No   Shingrix Completed?: Yes  Screening Tests Health Maintenance  Topic Date Due   COVID-19 Vaccine (4 - Booster for Moderna  series) 09/14/2020   MAMMOGRAM  09/09/2021   TETANUS/TDAP  03/07/2022   COLONOSCOPY (Pts 45-50yrs Insurance coverage will need to be confirmed)  09/29/2023   Pneumonia Vaccine 13+ Years old  Completed   INFLUENZA VACCINE  Completed   DEXA SCAN  Completed   Hepatitis C Screening  Completed   Zoster Vaccines- Shingrix  Completed   HPV VACCINES  Aged Out  Health Maintenance  Health Maintenance Due  Topic Date Due   COVID-19 Vaccine (4 - Booster for Moderna series) 09/14/2020    Colorectal cancer screening: Type of screening: Colonoscopy. Completed 09/28/2013. Repeat every 10 years  Mammogram status: Completed 09/09/2020. Repeat every year  Bone Density status: Completed 08/05/2020. Results reflect: Bone density results: OSTEOPENIA. Repeat every 5 years.  Lung Cancer Screening: (Low Dose CT Chest recommended if Age 55-80 years, 30 pack-year currently smoking OR have quit w/in 15years.) does not qualify.   Lung Cancer Screening Referral: n/a  Additional Screening:  Hepatitis C Screening: does not qualify; Completed 11/11/20231  Vision Screening: Recommended annual ophthalmology exams for early detection of glaucoma and other disorders of the eye. Is the patient up to date with their annual eye exam?  Yes  Who is the provider or what is the name of the office in which the patient attends annual eye exams? Dr.Parker  If pt is not established with a provider, would they like to be referred to a provider to establish care? No .   Dental Screening: Recommended annual dental exams for proper oral hygiene  Community Resource Referral / Chronic Care Management: CRR required this visit?  No   CCM required this visit?  No      Plan:     I have personally reviewed and noted the following in the patient's chart:   Medical and social history Use of alcohol, tobacco or illicit drugs  Current medications and supplements including opioid prescriptions.  Functional ability and  status Nutritional status Physical activity Advanced directives List of other physicians Hospitalizations, surgeries, and ER visits in previous 12 months Vitals Screenings to include cognitive, depression, and falls Referrals and appointments  In addition, I have reviewed and discussed with patient certain preventive protocols, quality metrics, and best practice recommendations. A written personalized care plan for preventive services as well as general preventive health recommendations were provided to patient.     March Rummage, LPN   [id number]   Nurse Notes: none

## 2021-09-05 ENCOUNTER — Encounter: Payer: Self-pay | Admitting: Family Medicine

## 2021-09-05 ENCOUNTER — Ambulatory Visit (INDEPENDENT_AMBULATORY_CARE_PROVIDER_SITE_OTHER): Payer: Medicare PPO | Admitting: Family Medicine

## 2021-09-05 VITALS — BP 130/84 | HR 66 | Temp 97.5°F | Resp 16 | Ht 65.0 in | Wt 151.6 lb

## 2021-09-05 DIAGNOSIS — Z0000 Encounter for general adult medical examination without abnormal findings: Secondary | ICD-10-CM | POA: Diagnosis not present

## 2021-09-05 DIAGNOSIS — E785 Hyperlipidemia, unspecified: Secondary | ICD-10-CM | POA: Diagnosis not present

## 2021-09-05 DIAGNOSIS — Z87448 Personal history of other diseases of urinary system: Secondary | ICD-10-CM

## 2021-09-05 LAB — HEPATIC FUNCTION PANEL
ALT: 15 U/L (ref 0–35)
AST: 20 U/L (ref 0–37)
Albumin: 4.3 g/dL (ref 3.5–5.2)
Alkaline Phosphatase: 65 U/L (ref 39–117)
Bilirubin, Direct: 0.1 mg/dL (ref 0.0–0.3)
Total Bilirubin: 0.5 mg/dL (ref 0.2–1.2)
Total Protein: 6.4 g/dL (ref 6.0–8.3)

## 2021-09-05 LAB — LIPID PANEL
Cholesterol: 187 mg/dL (ref 0–200)
HDL: 39.6 mg/dL (ref >39.00)
NonHDL: 147.7
Total CHOL/HDL Ratio: 5
Triglycerides: 242 mg/dL — ABNORMAL HIGH (ref 0.0–149.0)
VLDL: 48.4 mg/dL — ABNORMAL HIGH (ref 0.0–40.0)

## 2021-09-05 LAB — CBC WITH DIFFERENTIAL/PLATELET
Basophils Absolute: 0 10*3/uL (ref 0.0–0.1)
Basophils Relative: 1 % (ref 0.0–3.0)
Eosinophils Absolute: 0.1 10*3/uL (ref 0.0–0.7)
Eosinophils Relative: 1.9 % (ref 0.0–5.0)
HCT: 38.9 % (ref 36.0–46.0)
Hemoglobin: 12.8 g/dL (ref 12.0–15.0)
Lymphocytes Relative: 34.3 % (ref 12.0–46.0)
Lymphs Abs: 1.7 10*3/uL (ref 0.7–4.0)
MCHC: 32.9 g/dL (ref 30.0–36.0)
MCV: 84.9 fl (ref 78.0–100.0)
Monocytes Absolute: 0.5 10*3/uL (ref 0.1–1.0)
Monocytes Relative: 9.8 % (ref 3.0–12.0)
Neutro Abs: 2.7 10*3/uL (ref 1.4–7.7)
Neutrophils Relative %: 53 % (ref 43.0–77.0)
Platelets: 183 10*3/uL (ref 150.0–400.0)
RBC: 4.59 Mil/uL (ref 3.87–5.11)
RDW: 13.9 % (ref 11.5–15.5)
WBC: 5 10*3/uL (ref 4.0–10.5)

## 2021-09-05 LAB — BASIC METABOLIC PANEL
BUN: 13 mg/dL (ref 6–23)
CO2: 30 mEq/L (ref 19–32)
Calcium: 9.9 mg/dL (ref 8.4–10.5)
Chloride: 105 mEq/L (ref 96–112)
Creatinine, Ser: 0.68 mg/dL (ref 0.40–1.20)
GFR: 86.55 mL/min (ref >60.00)
Glucose, Bld: 76 mg/dL (ref 70–99)
Potassium: 4.2 mEq/L (ref 3.5–5.1)
Sodium: 141 mEq/L (ref 135–145)

## 2021-09-05 LAB — LDL CHOLESTEROL, DIRECT: Direct LDL: 114 mg/dL

## 2021-09-05 LAB — TSH: TSH: 2.44 u[IU]/mL (ref 0.35–5.50)

## 2021-09-05 MED ORDER — ALPRAZOLAM 0.5 MG PO TABS: 0.5000 mg | ORAL_TABLET | Freq: Two times a day (BID) | ORAL | 1 refills | Status: DC | PRN

## 2021-09-05 NOTE — Patient Instructions (Signed)
Follow up in 6 months to recheck cholesterol We'll notify you of your lab results and make any changes if needed Keep up the good work!  You look great! Schedule your mammogram when you get back Call with any questions or concerns Stay Safe!  Stay Healthy! Safe Travels! Hang in there!  Happy Holidays!

## 2021-09-05 NOTE — Progress Notes (Signed)
   Subjective:    Patient ID: Kristen Huffman, female    DOB: Oct 28, 1945, 75 y.o.   MRN: [id number]  HPI CPE- UTD on colonoscopy, mammo, DEXA, PNA vaccines, flu.  Patient Care Team    Relationship Specialty Notifications Start End  Sheliah Hatch, MD PCP - General Family Medicine  11/13/11   Jeani Hawking, MD Consulting Physician Gastroenterology  06/07/15   Ola Spurr., MD Consulting Physician Obstetrics and Gynecology  06/07/15   Dominica Severin, MD Consulting Physician Orthopedic Surgery  06/07/15   Mat Carne, DO Consulting Physician Optometry  06/07/15   Puschinsky, Adelfa Koh., MD Consulting Physician Urology  06/10/15   Leola Brazil  Dermatology  06/16/17     Health Maintenance  Topic Date Due   COVID-19 Vaccine (4 - Booster for Moderna series) 09/14/2020   MAMMOGRAM  09/09/2021   TETANUS/TDAP  03/07/2022   COLONOSCOPY (Pts 45-69yrs Insurance coverage will need to be confirmed)  09/29/2023   Pneumonia Vaccine 38+ Years old  Completed   INFLUENZA VACCINE  Completed   DEXA SCAN  Completed   Hepatitis C Screening  Completed   Zoster Vaccines- Shingrix  Completed   HPV VACCINES  Aged Out      Review of Systems Patient reports no vision/ hearing changes, adenopathy,fever, persistant/recurrent hoarseness , swallowing issues, chest pain, palpitations, edema, persistant/recurrent cough, hemoptysis, dyspnea (rest/exertional/paroxysmal nocturnal), gastrointestinal bleeding (melena, rectal bleeding), abdominal pain, significant heartburn, bowel changes, GU symptoms (dysuria, hematuria, incontinence), Gyn symptoms (abnormal  bleeding, pain),  syncope, focal weakness, memory loss, numbness & tingling, skin/hair/nail changes, abnormal bruising or bleeding, anxiety, or depression.   + 6 lb weight loss  This visit occurred during the SARS-CoV-2 public health emergency.  Safety protocols were in place, including screening questions prior to the visit, additional usage of staff PPE, and  extensive cleaning of exam room while observing appropriate contact time as indicated for disinfecting solutions.      Objective:   Physical Exam General Appearance:    Alert, cooperative, no distress, appears stated age  Head:    Normocephalic, without obvious abnormality, atraumatic  Eyes:    PERRL, conjunctiva/corneas clear, EOM's intact, fundi    benign, both eyes  Ears:    Normal TM's and external ear canals, both ears  Nose:   Deferred due to COVID  Throat:   Neck:   Supple, symmetrical, trachea midline, no adenopathy;    Thyroid: no enlargement/tenderness/nodules  Back:     Symmetric, no curvature, ROM normal, no CVA tenderness  Lungs:     Clear to auscultation bilaterally, respirations unlabored  Chest Wall:    No tenderness or deformity   Heart:    Regular rate and rhythm, S1 and S2 normal, no murmur, rub   or gallop  Breast Exam:    Deferred to mammo  Abdomen:     Soft, non-tender, bowel sounds active all four quadrants,    no masses, no organomegaly  Genitalia:    Deferred to GYN  Rectal:    Extremities:   Extremities normal, atraumatic, no cyanosis or edema  Pulses:   2+ and symmetric all extremities  Skin:   Skin color, texture, turgor normal, no rashes or lesions  Lymph nodes:   Cervical, supraclavicular, and axillary nodes normal  Neurologic:   CNII-XII intact, normal strength, sensation and reflexes    throughout          Assessment & Plan:

## 2021-09-08 NOTE — Progress Notes (Signed)
Called and discussed Lab results.

## 2021-10-06 ENCOUNTER — Encounter (HOSPITAL_BASED_OUTPATIENT_CLINIC_OR_DEPARTMENT_OTHER): Payer: Self-pay

## 2021-10-06 ENCOUNTER — Other Ambulatory Visit: Payer: Self-pay

## 2021-10-06 ENCOUNTER — Ambulatory Visit (HOSPITAL_BASED_OUTPATIENT_CLINIC_OR_DEPARTMENT_OTHER)
Admission: RE | Admit: 2021-10-06 | Discharge: 2021-10-06 | Disposition: A | Discharge status: Home / Self Care | Payer: Medicare PPO | Source: Ambulatory Visit | Attending: Family Medicine | Admitting: Family Medicine

## 2021-10-06 DIAGNOSIS — Z1231 Encounter for screening mammogram for malignant neoplasm of breast: Secondary | ICD-10-CM | POA: Diagnosis not present

## 2021-10-31 ENCOUNTER — Telehealth: Payer: Self-pay

## 2021-10-31 DIAGNOSIS — M25512 Pain in left shoulder: Secondary | ICD-10-CM | POA: Diagnosis not present

## 2021-10-31 DIAGNOSIS — M7541 Impingement syndrome of right shoulder: Secondary | ICD-10-CM | POA: Diagnosis not present

## 2021-10-31 DIAGNOSIS — M7551 Bursitis of right shoulder: Secondary | ICD-10-CM | POA: Diagnosis not present

## 2021-10-31 DIAGNOSIS — M7552 Bursitis of left shoulder: Secondary | ICD-10-CM | POA: Diagnosis not present

## 2021-10-31 DIAGNOSIS — M25511 Pain in right shoulder: Secondary | ICD-10-CM | POA: Diagnosis not present

## 2021-10-31 DIAGNOSIS — M7542 Impingement syndrome of left shoulder: Secondary | ICD-10-CM | POA: Diagnosis not present

## 2021-10-31 DIAGNOSIS — M542 Cervicalgia: Secondary | ICD-10-CM | POA: Diagnosis not present

## 2021-11-04 NOTE — Telephone Encounter (Signed)
error 

## 2021-11-13 DIAGNOSIS — M25512 Pain in left shoulder: Secondary | ICD-10-CM | POA: Diagnosis not present

## 2021-11-13 DIAGNOSIS — M25511 Pain in right shoulder: Secondary | ICD-10-CM | POA: Diagnosis not present

## 2021-11-18 DIAGNOSIS — M25511 Pain in right shoulder: Secondary | ICD-10-CM | POA: Diagnosis not present

## 2021-11-18 DIAGNOSIS — M25512 Pain in left shoulder: Secondary | ICD-10-CM | POA: Diagnosis not present

## 2021-11-21 DIAGNOSIS — M25512 Pain in left shoulder: Secondary | ICD-10-CM | POA: Diagnosis not present

## 2021-11-21 DIAGNOSIS — M25511 Pain in right shoulder: Secondary | ICD-10-CM | POA: Diagnosis not present

## 2021-11-25 DIAGNOSIS — M25512 Pain in left shoulder: Secondary | ICD-10-CM | POA: Diagnosis not present

## 2021-11-25 DIAGNOSIS — M25511 Pain in right shoulder: Secondary | ICD-10-CM | POA: Diagnosis not present

## 2021-11-28 DIAGNOSIS — M25511 Pain in right shoulder: Secondary | ICD-10-CM | POA: Diagnosis not present

## 2021-11-28 DIAGNOSIS — M25512 Pain in left shoulder: Secondary | ICD-10-CM | POA: Diagnosis not present

## 2021-12-03 DIAGNOSIS — M25512 Pain in left shoulder: Secondary | ICD-10-CM | POA: Diagnosis not present

## 2021-12-03 DIAGNOSIS — M25511 Pain in right shoulder: Secondary | ICD-10-CM | POA: Diagnosis not present

## 2021-12-09 DIAGNOSIS — Z01411 Encounter for gynecological examination (general) (routine) with abnormal findings: Secondary | ICD-10-CM | POA: Diagnosis not present

## 2021-12-09 DIAGNOSIS — N905 Atrophy of vulva: Secondary | ICD-10-CM | POA: Diagnosis not present

## 2021-12-09 DIAGNOSIS — N952 Postmenopausal atrophic vaginitis: Secondary | ICD-10-CM | POA: Diagnosis not present

## 2021-12-17 DIAGNOSIS — M25511 Pain in right shoulder: Secondary | ICD-10-CM | POA: Diagnosis not present

## 2021-12-17 DIAGNOSIS — M25512 Pain in left shoulder: Secondary | ICD-10-CM | POA: Diagnosis not present

## 2021-12-17 DIAGNOSIS — M7552 Bursitis of left shoulder: Secondary | ICD-10-CM | POA: Diagnosis not present

## 2021-12-17 DIAGNOSIS — M7542 Impingement syndrome of left shoulder: Secondary | ICD-10-CM | POA: Diagnosis not present

## 2021-12-28 DIAGNOSIS — M25511 Pain in right shoulder: Secondary | ICD-10-CM | POA: Diagnosis not present

## 2021-12-28 DIAGNOSIS — M25512 Pain in left shoulder: Secondary | ICD-10-CM | POA: Diagnosis not present

## 2021-12-29 DIAGNOSIS — Z884 Allergy status to anesthetic agent status: Secondary | ICD-10-CM | POA: Diagnosis not present

## 2021-12-29 DIAGNOSIS — Z8249 Family history of ischemic heart disease and other diseases of the circulatory system: Secondary | ICD-10-CM | POA: Diagnosis not present

## 2021-12-29 DIAGNOSIS — Z87891 Personal history of nicotine dependence: Secondary | ICD-10-CM | POA: Diagnosis not present

## 2021-12-29 DIAGNOSIS — Z880 Allergy status to penicillin: Secondary | ICD-10-CM | POA: Diagnosis not present

## 2021-12-29 DIAGNOSIS — E785 Hyperlipidemia, unspecified: Secondary | ICD-10-CM | POA: Diagnosis not present

## 2021-12-29 DIAGNOSIS — R030 Elevated blood-pressure reading, without diagnosis of hypertension: Secondary | ICD-10-CM | POA: Diagnosis not present

## 2021-12-29 DIAGNOSIS — Z7722 Contact with and (suspected) exposure to environmental tobacco smoke (acute) (chronic): Secondary | ICD-10-CM | POA: Diagnosis not present

## 2021-12-31 DIAGNOSIS — X32XXXA Exposure to sunlight, initial encounter: Secondary | ICD-10-CM | POA: Diagnosis not present

## 2021-12-31 DIAGNOSIS — L538 Other specified erythematous conditions: Secondary | ICD-10-CM | POA: Diagnosis not present

## 2021-12-31 DIAGNOSIS — L570 Actinic keratosis: Secondary | ICD-10-CM | POA: Diagnosis not present

## 2021-12-31 DIAGNOSIS — L821 Other seborrheic keratosis: Secondary | ICD-10-CM | POA: Diagnosis not present

## 2021-12-31 DIAGNOSIS — L814 Other melanin hyperpigmentation: Secondary | ICD-10-CM | POA: Diagnosis not present

## 2021-12-31 DIAGNOSIS — D225 Melanocytic nevi of trunk: Secondary | ICD-10-CM | POA: Diagnosis not present

## 2021-12-31 DIAGNOSIS — L820 Inflamed seborrheic keratosis: Secondary | ICD-10-CM | POA: Diagnosis not present

## 2022-01-26 DIAGNOSIS — M7542 Impingement syndrome of left shoulder: Secondary | ICD-10-CM | POA: Diagnosis not present

## 2022-01-26 DIAGNOSIS — M7552 Bursitis of left shoulder: Secondary | ICD-10-CM | POA: Diagnosis not present

## 2022-01-26 DIAGNOSIS — M75121 Complete rotator cuff tear or rupture of right shoulder, not specified as traumatic: Secondary | ICD-10-CM | POA: Diagnosis not present

## 2022-02-01 ENCOUNTER — Other Ambulatory Visit: Payer: Self-pay | Admitting: Family Medicine

## 2022-02-10 DIAGNOSIS — G8918 Other acute postprocedural pain: Secondary | ICD-10-CM | POA: Diagnosis not present

## 2022-02-10 DIAGNOSIS — M75111 Incomplete rotator cuff tear or rupture of right shoulder, not specified as traumatic: Secondary | ICD-10-CM | POA: Diagnosis not present

## 2022-02-10 DIAGNOSIS — M94261 Chondromalacia, right knee: Secondary | ICD-10-CM | POA: Diagnosis not present

## 2022-02-10 DIAGNOSIS — M19011 Primary osteoarthritis, right shoulder: Secondary | ICD-10-CM | POA: Diagnosis not present

## 2022-02-10 DIAGNOSIS — S43431A Superior glenoid labrum lesion of right shoulder, initial encounter: Secondary | ICD-10-CM | POA: Diagnosis not present

## 2022-02-10 DIAGNOSIS — M7541 Impingement syndrome of right shoulder: Secondary | ICD-10-CM | POA: Diagnosis not present

## 2022-02-10 HISTORY — PX: SHOULDER ARTHROSCOPY WITH LABRAL REPAIR: SHX5691

## 2022-02-13 DIAGNOSIS — M19012 Primary osteoarthritis, left shoulder: Secondary | ICD-10-CM | POA: Diagnosis not present

## 2022-02-17 DIAGNOSIS — M25511 Pain in right shoulder: Secondary | ICD-10-CM | POA: Diagnosis not present

## 2022-02-25 DIAGNOSIS — M25511 Pain in right shoulder: Secondary | ICD-10-CM | POA: Diagnosis not present

## 2022-02-25 DIAGNOSIS — Z4789 Encounter for other orthopedic aftercare: Secondary | ICD-10-CM | POA: Diagnosis not present

## 2022-03-04 DIAGNOSIS — M25511 Pain in right shoulder: Secondary | ICD-10-CM | POA: Diagnosis not present

## 2022-03-06 ENCOUNTER — Encounter: Payer: Self-pay | Admitting: Family Medicine

## 2022-03-06 ENCOUNTER — Ambulatory Visit: Payer: Medicare PPO | Admitting: Family Medicine

## 2022-03-06 VITALS — BP 116/70 | HR 68 | Temp 98.0°F | Resp 16 | Ht 65.0 in | Wt 141.5 lb

## 2022-03-06 DIAGNOSIS — R3129 Other microscopic hematuria: Secondary | ICD-10-CM

## 2022-03-06 DIAGNOSIS — E785 Hyperlipidemia, unspecified: Secondary | ICD-10-CM | POA: Diagnosis not present

## 2022-03-06 DIAGNOSIS — E663 Overweight: Secondary | ICD-10-CM | POA: Diagnosis not present

## 2022-03-06 DIAGNOSIS — R319 Hematuria, unspecified: Secondary | ICD-10-CM

## 2022-03-06 LAB — LIPID PANEL
Cholesterol: 189 mg/dL (ref 0–200)
HDL: 51.8 mg/dL (ref >39.00)
LDL Cholesterol: 106 mg/dL — ABNORMAL HIGH (ref 0–99)
NonHDL: 137
Total CHOL/HDL Ratio: 4
Triglycerides: 154 mg/dL — ABNORMAL HIGH (ref 0.0–149.0)
VLDL: 30.8 mg/dL (ref 0.0–40.0)

## 2022-03-06 LAB — BASIC METABOLIC PANEL
BUN: 13 mg/dL (ref 6–23)
CO2: 26 mEq/L (ref 19–32)
Calcium: 9.8 mg/dL (ref 8.4–10.5)
Chloride: 106 mEq/L (ref 96–112)
Creatinine, Ser: 0.74 mg/dL (ref 0.40–1.20)
GFR: 80.12 mL/min (ref >60.00)
Glucose, Bld: 82 mg/dL (ref 70–99)
Potassium: 4.4 mEq/L (ref 3.5–5.1)
Sodium: 143 mEq/L (ref 135–145)

## 2022-03-06 LAB — HEPATIC FUNCTION PANEL
ALT: 17 U/L (ref 0–35)
AST: 19 U/L (ref 0–37)
Albumin: 4.2 g/dL (ref 3.5–5.2)
Alkaline Phosphatase: 66 U/L (ref 39–117)
Bilirubin, Direct: 0.1 mg/dL (ref 0.0–0.3)
Total Bilirubin: 0.6 mg/dL (ref 0.2–1.2)
Total Protein: 6.3 g/dL (ref 6.0–8.3)

## 2022-03-06 LAB — CBC WITH DIFFERENTIAL/PLATELET
Basophils Absolute: 0.1 10*3/uL (ref 0.0–0.1)
Basophils Relative: 1 % (ref 0.0–3.0)
Eosinophils Absolute: 0.1 10*3/uL (ref 0.0–0.7)
Eosinophils Relative: 2.5 % (ref 0.0–5.0)
HCT: 37.4 % (ref 36.0–46.0)
Hemoglobin: 12.7 g/dL (ref 12.0–15.0)
Lymphocytes Relative: 40.9 % (ref 12.0–46.0)
Lymphs Abs: 2.2 10*3/uL (ref 0.7–4.0)
MCHC: 33.8 g/dL (ref 30.0–36.0)
MCV: 86.2 fl (ref 78.0–100.0)
Monocytes Absolute: 0.4 10*3/uL (ref 0.1–1.0)
Monocytes Relative: 7.5 % (ref 3.0–12.0)
Neutro Abs: 2.6 10*3/uL (ref 1.4–7.7)
Neutrophils Relative %: 48.1 % (ref 43.0–77.0)
Platelets: 189 10*3/uL (ref 150.0–400.0)
RBC: 4.34 Mil/uL (ref 3.87–5.11)
RDW: 14.3 % (ref 11.5–15.5)
WBC: 5.5 10*3/uL (ref 4.0–10.5)

## 2022-03-06 LAB — TSH: TSH: 1.36 u[IU]/mL (ref 0.35–5.50)

## 2022-03-06 LAB — POCT URINALYSIS DIPSTICK
Bilirubin, UA: NEGATIVE
Blood, UA: NEGATIVE
Glucose, UA: NEGATIVE
Ketones, UA: NEGATIVE
Leukocytes, UA: NEGATIVE
Nitrite, UA: NEGATIVE
Protein, UA: NEGATIVE
Spec Grav, UA: 1.015 (ref 1.010–1.025)
Urobilinogen, UA: 0.2 E.U./dL
pH, UA: 6 (ref 5.0–8.0)

## 2022-03-06 NOTE — Assessment & Plan Note (Signed)
Chronic problem.  Currently on Lipitor 10mg daily w/o difficulty.  Check labs.  Adjust meds prn  

## 2022-03-06 NOTE — Patient Instructions (Signed)
Schedule your complete physical in 6 months We'll notify you of your lab results and make any changes if needed Keep up the good work on healthy diet and regular exercise- you look great! Call with any questions or concerns Have a great summer!!! 

## 2022-03-06 NOTE — Progress Notes (Signed)
   Subjective:    Patient ID: Kristen Huffman, female    DOB: January 08, 1946, 76 y.o.   MRN: [id number]  HPI Hyperlipidemia- chronic problem, on Lipitor 10mg  daily.  Denies CP, SOB, abd pain, N/V.  Hx of microscopic hematuria- pt would like UA repeated today  Overweight- pt is down 10 lbs since last visit in December.  Pt reports eating well just less than usual.  Staying active.   Review of Systems For ROS see HPI     Objective:   Physical Exam Vitals reviewed.  Constitutional:      General: She is not in acute distress.    Appearance: Normal appearance. She is well-developed. She is not ill-appearing.  HENT:     Head: Normocephalic and atraumatic.  Eyes:     Conjunctiva/sclera: Conjunctivae normal.     Pupils: Pupils are equal, round, and reactive to light.  Neck:     Thyroid: No thyromegaly.  Cardiovascular:     Rate and Rhythm: Normal rate and regular rhythm.     Pulses: Normal pulses.     Heart sounds: Normal heart sounds. No murmur heard. Pulmonary:     Effort: Pulmonary effort is normal. No respiratory distress.     Breath sounds: Normal breath sounds.  Abdominal:     General: There is no distension.     Palpations: Abdomen is soft.     Tenderness: There is no abdominal tenderness.  Musculoskeletal:     Cervical back: Normal range of motion and neck supple.     Right lower leg: No edema.     Left lower leg: No edema.  Lymphadenopathy:     Cervical: No cervical adenopathy.  Skin:    General: Skin is warm and dry.  Neurological:     General: No focal deficit present.     Mental Status: She is alert and oriented to person, place, and time.  Psychiatric:        Mood and Affect: Mood normal.        Behavior: Behavior normal.           Assessment & Plan:

## 2022-03-06 NOTE — Assessment & Plan Note (Signed)
Pt has hx of this in the past and just likes to keep a check on this rather than returning to urology.  Thankfully no evidence of blood today.

## 2022-03-06 NOTE — Assessment & Plan Note (Signed)
Pt is down 10 lbs since last visit.  BMI is now in normal range at 23.55  She is eating well- just less- and is very active.  Applauded her efforts.

## 2022-03-09 ENCOUNTER — Telehealth: Payer: Self-pay

## 2022-03-09 NOTE — Telephone Encounter (Signed)
-----   Message from Sheliah Hatch, MD sent at 03/07/2022  5:55 PM EDT ----- Labs look great!  No changes at this time

## 2022-03-09 NOTE — Telephone Encounter (Signed)
Spoke w/ pt and informed of lab results  ?

## 2022-03-18 DIAGNOSIS — M25511 Pain in right shoulder: Secondary | ICD-10-CM | POA: Diagnosis not present

## 2022-03-26 ENCOUNTER — Encounter: Payer: Self-pay | Admitting: Family Medicine

## 2022-06-22 NOTE — Assessment & Plan Note (Signed)
Pt's PE WNL.  UTD on colonoscopy, mammo, DEXA, PNA, flu.  Check labs.  Anticipatory guidance provided.

## 2022-06-22 NOTE — Assessment & Plan Note (Signed)
Chronic problem.  Check labs.  Adjust meds prn  

## 2022-07-27 DIAGNOSIS — M79641 Pain in right hand: Secondary | ICD-10-CM | POA: Diagnosis not present

## 2022-07-29 DIAGNOSIS — S62614A Displaced fracture of proximal phalanx of right ring finger, initial encounter for closed fracture: Secondary | ICD-10-CM | POA: Diagnosis not present

## 2022-07-29 DIAGNOSIS — S62616P Displaced fracture of proximal phalanx of right little finger, subsequent encounter for fracture with malunion: Secondary | ICD-10-CM | POA: Diagnosis not present

## 2022-07-31 DIAGNOSIS — G8918 Other acute postprocedural pain: Secondary | ICD-10-CM | POA: Diagnosis not present

## 2022-07-31 DIAGNOSIS — S62616A Displaced fracture of proximal phalanx of right little finger, initial encounter for closed fracture: Secondary | ICD-10-CM | POA: Diagnosis not present

## 2022-07-31 DIAGNOSIS — S62614A Displaced fracture of proximal phalanx of right ring finger, initial encounter for closed fracture: Secondary | ICD-10-CM | POA: Diagnosis not present

## 2022-08-12 DIAGNOSIS — Z4789 Encounter for other orthopedic aftercare: Secondary | ICD-10-CM | POA: Diagnosis not present

## 2022-08-12 DIAGNOSIS — M25641 Stiffness of right hand, not elsewhere classified: Secondary | ICD-10-CM | POA: Diagnosis not present

## 2022-08-13 ENCOUNTER — Other Ambulatory Visit: Payer: Self-pay | Admitting: Family Medicine

## 2022-08-19 DIAGNOSIS — M25641 Stiffness of right hand, not elsewhere classified: Secondary | ICD-10-CM | POA: Diagnosis not present

## 2022-08-19 DIAGNOSIS — Z4789 Encounter for other orthopedic aftercare: Secondary | ICD-10-CM | POA: Diagnosis not present

## 2022-08-24 DIAGNOSIS — Z4789 Encounter for other orthopedic aftercare: Secondary | ICD-10-CM | POA: Diagnosis not present

## 2022-08-25 DIAGNOSIS — H43813 Vitreous degeneration, bilateral: Secondary | ICD-10-CM | POA: Diagnosis not present

## 2022-08-25 DIAGNOSIS — H25813 Combined forms of age-related cataract, bilateral: Secondary | ICD-10-CM | POA: Diagnosis not present

## 2022-08-25 DIAGNOSIS — H04123 Dry eye syndrome of bilateral lacrimal glands: Secondary | ICD-10-CM | POA: Diagnosis not present

## 2022-08-28 DIAGNOSIS — M25641 Stiffness of right hand, not elsewhere classified: Secondary | ICD-10-CM | POA: Diagnosis not present

## 2022-08-31 DIAGNOSIS — Z4789 Encounter for other orthopedic aftercare: Secondary | ICD-10-CM | POA: Diagnosis not present

## 2022-08-31 DIAGNOSIS — M25641 Stiffness of right hand, not elsewhere classified: Secondary | ICD-10-CM | POA: Diagnosis not present

## 2022-08-31 DIAGNOSIS — G5601 Carpal tunnel syndrome, right upper limb: Secondary | ICD-10-CM | POA: Diagnosis not present

## 2022-09-08 DIAGNOSIS — M25641 Stiffness of right hand, not elsewhere classified: Secondary | ICD-10-CM | POA: Diagnosis not present

## 2022-09-10 ENCOUNTER — Ambulatory Visit (INDEPENDENT_AMBULATORY_CARE_PROVIDER_SITE_OTHER): Payer: Medicare PPO | Admitting: Family Medicine

## 2022-09-10 ENCOUNTER — Telehealth: Payer: Self-pay

## 2022-09-10 ENCOUNTER — Encounter: Payer: Self-pay | Admitting: Family Medicine

## 2022-09-10 VITALS — BP 126/84 | HR 67 | Temp 97.3°F | Resp 18 | Ht 65.0 in | Wt 137.2 lb

## 2022-09-10 DIAGNOSIS — Z0000 Encounter for general adult medical examination without abnormal findings: Secondary | ICD-10-CM | POA: Diagnosis not present

## 2022-09-10 DIAGNOSIS — E785 Hyperlipidemia, unspecified: Secondary | ICD-10-CM

## 2022-09-10 LAB — LIPID PANEL
Cholesterol: 176 mg/dL (ref 0–200)
HDL: 49.1 mg/dL (ref >39.00)
LDL Cholesterol: 98 mg/dL (ref 0–99)
NonHDL: 127.08
Total CHOL/HDL Ratio: 4
Triglycerides: 143 mg/dL (ref 0.0–149.0)
VLDL: 28.6 mg/dL (ref 0.0–40.0)

## 2022-09-10 LAB — CBC WITH DIFFERENTIAL/PLATELET
Basophils Absolute: 0 10*3/uL (ref 0.0–0.1)
Basophils Relative: 0.9 % (ref 0.0–3.0)
Eosinophils Absolute: 0.1 10*3/uL (ref 0.0–0.7)
Eosinophils Relative: 2.5 % (ref 0.0–5.0)
HCT: 39 % (ref 36.0–46.0)
Hemoglobin: 13 g/dL (ref 12.0–15.0)
Lymphocytes Relative: 40.9 % (ref 12.0–46.0)
Lymphs Abs: 2 10*3/uL (ref 0.7–4.0)
MCHC: 33.4 g/dL (ref 30.0–36.0)
MCV: 85 fl (ref 78.0–100.0)
Monocytes Absolute: 0.5 10*3/uL (ref 0.1–1.0)
Monocytes Relative: 9.3 % (ref 3.0–12.0)
Neutro Abs: 2.3 10*3/uL (ref 1.4–7.7)
Neutrophils Relative %: 46.4 % (ref 43.0–77.0)
Platelets: 193 10*3/uL (ref 150.0–400.0)
RBC: 4.59 Mil/uL (ref 3.87–5.11)
RDW: 14.1 % (ref 11.5–15.5)
WBC: 4.9 10*3/uL (ref 4.0–10.5)

## 2022-09-10 LAB — BASIC METABOLIC PANEL
BUN: 11 mg/dL (ref 6–23)
CO2: 30 mEq/L (ref 19–32)
Calcium: 9.6 mg/dL (ref 8.4–10.5)
Chloride: 106 mEq/L (ref 96–112)
Creatinine, Ser: 0.63 mg/dL (ref 0.40–1.20)
GFR: 87.53 mL/min (ref >60.00)
Glucose, Bld: 83 mg/dL (ref 70–99)
Potassium: 4.1 mEq/L (ref 3.5–5.1)
Sodium: 142 mEq/L (ref 135–145)

## 2022-09-10 LAB — HEPATIC FUNCTION PANEL
ALT: 16 U/L (ref 0–35)
AST: 17 U/L (ref 0–37)
Albumin: 4.3 g/dL (ref 3.5–5.2)
Alkaline Phosphatase: 61 U/L (ref 39–117)
Bilirubin, Direct: 0.1 mg/dL (ref 0.0–0.3)
Total Bilirubin: 0.6 mg/dL (ref 0.2–1.2)
Total Protein: 6.5 g/dL (ref 6.0–8.3)

## 2022-09-10 LAB — TSH: TSH: 1.26 u[IU]/mL (ref 0.35–5.50)

## 2022-09-10 NOTE — Assessment & Plan Note (Signed)
Pt's PE WNL.  UTD on mammo, colonoscopy, PNA.  Check labs.  Anticipatory guidance provided.

## 2022-09-10 NOTE — Assessment & Plan Note (Signed)
Chronic problem.  On a statin daily w/o difficulty.  Check labs.  Adjust meds prn

## 2022-09-10 NOTE — Telephone Encounter (Signed)
-----   Message from Sheliah Hatch, MD sent at 09/10/2022  3:30 PM EST ----- Labs look great!  No changes at this time

## 2022-09-10 NOTE — Progress Notes (Signed)
   Subjective:    Patient ID: Kristen Huffman, female    DOB: [birth year], 76 y.o.   MRN: [id number]  HPI CPE- UTD on mammo, colonoscopy, PNA.  Declined flu  Patient Care Team    Relationship Specialty Notifications Start End  Sheliah Hatch, MD PCP - General Family Medicine  11/13/11   Jeani Hawking, MD Consulting Physician Gastroenterology  06/07/15   Ola Spurr., MD Consulting Physician Obstetrics and Gynecology  06/07/15   Dominica Severin, MD Consulting Physician Orthopedic Surgery  06/07/15   Mat Carne, DO Consulting Physician Optometry  06/07/15   Puschinsky, Adelfa Koh., MD Consulting Physician Urology  06/10/15   Leola Brazil  Dermatology  06/16/17     Health Maintenance  Topic Date Due   Medicare Annual Wellness (AWV)  09/04/2022   INFLUENZA VACCINE  12/27/2022 (Originally 04/28/2022)   MAMMOGRAM  10/06/2022   COLONOSCOPY (Pts 45-44yrs Insurance coverage will need to be confirmed)  09/29/2023   Pneumonia Vaccine 47+ Years old  Completed   DEXA SCAN  Completed   Hepatitis C Screening  Completed   Zoster Vaccines- Shingrix  Completed   HPV VACCINES  Aged Out   DTaP/Tdap/Td  Discontinued   COVID-19 Vaccine  Discontinued      Review of Systems Patient reports no vision/ hearing changes, adenopathy,fever.  persistant/recurrent hoarseness , swallowing issues, chest pain, palpitations, edema, persistant/recurrent cough, hemoptysis, dyspnea (rest/exertional/paroxysmal nocturnal), gastrointestinal bleeding (melena, rectal bleeding), abdominal pain, significant heartburn, bowel changes, GU symptoms (dysuria, hematuria, incontinence), Gyn symptoms (abnormal  bleeding, pain),  syncope, focal weakness, memory loss, numbness & tingling, skin/hair/nail changes, abnormal bruising or bleeding, anxiety, or depression.   + 5 lb weight loss    Objective:   Physical Exam General Appearance:    Alert, cooperative, no distress, appears stated age  Head:    Normocephalic, without obvious  abnormality, atraumatic  Eyes:    PERRL, conjunctiva/corneas clear, EOM's intact both eyes  Ears:    Normal TM's and external ear canals, both ears  Nose:   Nares normal, septum midline, mucosa normal, no drainage    or sinus tenderness  Throat:   Lips, mucosa, and tongue normal; teeth and gums normal  Neck:   Supple, symmetrical, trachea midline, no adenopathy;    Thyroid: no enlargement/tenderness/nodules  Back:     Symmetric, no curvature, ROM normal, no CVA tenderness  Lungs:     Clear to auscultation bilaterally, respirations unlabored  Chest Wall:    No tenderness or deformity   Heart:    Regular rate and rhythm, S1 and S2 normal, no murmur, rub   or gallop  Breast Exam:    Deferred to GYN  Abdomen:     Soft, non-tender, bowel sounds active all four quadrants,    no masses, no organomegaly  Genitalia:    Deferred to GYN  Rectal:    Extremities:   Extremities normal, atraumatic, no cyanosis or edema  Pulses:   2+ and symmetric all extremities  Skin:   Skin color, texture, turgor normal, no rashes or lesions  Lymph nodes:   Cervical, supraclavicular, and axillary nodes normal  Neurologic:   CNII-XII intact, normal strength, sensation and reflexes    throughout          Assessment & Plan:

## 2022-09-10 NOTE — Patient Instructions (Addendum)
Follow up in 6 months to recheck cholesterol We'll notify you of your lab results and make any changes if needed Keep up the good work!  You look great!! Call with any questions or concerns Stay Safe!  Stay Healthy! Happy Holidays!!! 

## 2022-09-10 NOTE — Telephone Encounter (Signed)
Informed pt of lab results  

## 2022-09-16 DIAGNOSIS — M25641 Stiffness of right hand, not elsewhere classified: Secondary | ICD-10-CM | POA: Diagnosis not present

## 2022-09-30 DIAGNOSIS — M25641 Stiffness of right hand, not elsewhere classified: Secondary | ICD-10-CM | POA: Diagnosis not present

## 2022-10-08 DIAGNOSIS — M25641 Stiffness of right hand, not elsewhere classified: Secondary | ICD-10-CM | POA: Diagnosis not present

## 2022-10-15 ENCOUNTER — Other Ambulatory Visit (HOSPITAL_BASED_OUTPATIENT_CLINIC_OR_DEPARTMENT_OTHER): Payer: Self-pay | Admitting: Family Medicine

## 2022-10-15 DIAGNOSIS — Z1231 Encounter for screening mammogram for malignant neoplasm of breast: Secondary | ICD-10-CM

## 2022-10-15 DIAGNOSIS — M25641 Stiffness of right hand, not elsewhere classified: Secondary | ICD-10-CM | POA: Diagnosis not present

## 2022-10-20 ENCOUNTER — Encounter (HOSPITAL_BASED_OUTPATIENT_CLINIC_OR_DEPARTMENT_OTHER): Payer: Self-pay

## 2022-10-20 ENCOUNTER — Ambulatory Visit (HOSPITAL_BASED_OUTPATIENT_CLINIC_OR_DEPARTMENT_OTHER)
Admission: RE | Admit: 2022-10-20 | Discharge: 2022-10-20 | Disposition: A | Discharge status: Home / Self Care | Payer: Medicare PPO | Source: Ambulatory Visit | Attending: Family Medicine | Admitting: Family Medicine

## 2022-10-20 DIAGNOSIS — Z1231 Encounter for screening mammogram for malignant neoplasm of breast: Secondary | ICD-10-CM | POA: Diagnosis not present

## 2022-10-20 DIAGNOSIS — M25641 Stiffness of right hand, not elsewhere classified: Secondary | ICD-10-CM | POA: Diagnosis not present

## 2022-10-20 LAB — HM MAMMOGRAPHY

## 2022-10-21 ENCOUNTER — Ambulatory Visit (INDEPENDENT_AMBULATORY_CARE_PROVIDER_SITE_OTHER): Payer: Medicare PPO

## 2022-10-21 VITALS — Ht 65.0 in | Wt 137.0 lb

## 2022-10-21 DIAGNOSIS — Z0000 Encounter for general adult medical examination without abnormal findings: Secondary | ICD-10-CM | POA: Diagnosis not present

## 2022-10-21 NOTE — Progress Notes (Signed)
Subjective:   Kristen Huffman is a 77 y.o. female who presents for Medicare Annual (Subsequent) preventive examination.  Review of Systems    Virtual Visit via Telephone Note  I connected with  Kristen Huffman on 10/21/22 at  9:00 AM EST by telephone and verified that I am speaking with the correct person using two identifiers.  Location: Patient: Home Provider: Office Persons participating in the virtual visit: patient/Nurse Health Advisor   I discussed the limitations, risks, security and privacy concerns of performing an evaluation and management service by telephone and the availability of in person appointments. The patient expressed understanding and agreed to proceed.  Interactive audio and video telecommunications were attempted between this nurse and patient, however failed, due to patient having technical difficulties OR patient did not have access to video capability.  We continued and completed visit with audio only.  Some vital signs may be absent or patient reported.   Tillie Rung, LPN  Cardiac Risk Factors include: advanced age (>18men, >62 women);obesity (BMI >30kg/m2)     Objective:    Today's Vitals   10/21/22 0909  Weight: 137 lb (62.1 kg)  Height: 5\' 5"  (1.651 m)   Body mass index is 22.8 kg/m.     10/21/2022    9:19 AM 09/04/2021    3:57 PM 08/05/2020    3:09 PM 05/11/2020   12:33 PM 06/16/2017    8:23 AM 04/11/2017   11:32 AM 02/22/2015   12:54 PM  Advanced Directives  Does Patient Have a Medical Advance Directive? Yes Yes Yes Yes Yes No Yes  Type of 02/24/2015 of [place];Living will Healthcare Power of [place];Living will Healthcare Power of [place];Living will  Living will;Healthcare Power of [place] Power of [place];Living will  Does patient want to make changes to medical advance directive?       No - Patient declined  Copy of Healthcare Power of Attorney in Chart? No - copy requested No - copy requested No -  copy requested  No - copy requested  No - copy requested    Current Medications (verified) Outpatient Encounter Medications as of 10/21/2022  Medication Sig   ALPRAZolam (XANAX) 0.5 MG tablet Take 1 tablet (0.5 mg total) by mouth 2 (two) times daily as needed for anxiety.   atorvastatin (LIPITOR) 10 MG tablet TAKE 1 TABLET BY MOUTH EVERY DAY   cetirizine (ZYRTEC) 10 MG tablet Take 10 mg by mouth daily.   Cyanocobalamin (VITAMIN B-12 PO) Take 200 mcg by mouth daily.   Multiple Vitamin (MULTIVITAMIN) tablet Take 1 tablet by mouth daily.   No facility-administered encounter medications on file as of 10/21/2022.    Allergies (verified) Tetanus toxoids, Codeine, Novocain [procaine], and Penicillins   History: Past Medical History:  Diagnosis Date   Dental crown present    Extensor tenosynovitis of left wrist 01/2015   History of hepatitis A    Hyperlipidemia    Past Surgical History:  Procedure Laterality Date   DORSAL COMPARTMENT RELEASE Left 02/22/2015   Procedure: RELEASE LEFT FIRST DORSAL COMPARTMENT (DEQUERVAIN) AND RADIAL TENOSYNOVECTOMY;  Surgeon: 02/24/2015, MD;  Location: [facility];  Service: Orthopedics;  Laterality: Left;   EYE FOREIGN BODY REMOVAL     INCONTINENCE SURGERY     KNEE ARTHROSCOPY W/ MENISCAL REPAIR Right    REPAIR ANKLE LIGAMENT Left    SHOULDER ARTHROSCOPY WITH LABRAL REPAIR Right 02/10/2022   TENDON REPAIR Left    wrist   Family History  Problem Relation Age of Onset   Heart attack Father 66   Hypertension Mother    Heart failure Mother    Social History   Socioeconomic History   Marital status: Widowed    Spouse name: Not on file   Number of children: 2   Years of education: Not on file   Highest education level: Not on file  Occupational History   Not on file  Tobacco Use   Smoking status: Former    Packs/day: 0.50    Years: 15.00    Total pack years: 7.50    Types: Cigarettes    Quit date: 09/29/1979    Years since  quitting: 43.0   Smokeless tobacco: Never  Substance and Sexual Activity   Alcohol use: No    Alcohol/week: 0.0 standard drinks of alcohol   Drug use: No   Sexual activity: Not on file  Other Topics Concern   Not on file  Social History Narrative   Not on file   Social Determinants of Health   Financial Resource Strain: Low Risk  (10/21/2022)   Overall Financial Resource Strain (CARDIA)    Difficulty of Paying Living Expenses: Not hard at all  Food Insecurity: No Food Insecurity (10/21/2022)   Hunger Vital Sign    Worried About Running Out of Food in the Last Year: Never true    Ran Out of Food in the Last Year: Never true  Transportation Needs: No Transportation Needs (10/21/2022)   PRAPARE - Administrator, Civil Service (Medical): No    Lack of Transportation (Non-Medical): No  Physical Activity: Insufficiently Active (10/21/2022)   Exercise Vital Sign    Days of Exercise per Week: 3 days    Minutes of Exercise per Session: 30 min  Stress: No Stress Concern Present (10/21/2022)   [employer] of Occupational Health - Occupational Stress Questionnaire    Feeling of Stress : Not at all  Social Connections: Moderately Integrated (10/21/2022)   Social Connection and Isolation Panel [NHANES]    Frequency of Communication with Friends and Family: More than three times a week    Frequency of Social Gatherings with Friends and Family: More than three times a week    Attends Religious Services: More than 4 times per year    Active Member of [employer] or Organizations: Yes    Attends Banker Meetings: More than 4 times per year    Marital Status: Widowed    Tobacco Counseling Counseling given: Not Answered   Clinical Intake:  Pre-visit preparation completed: Yes  Pain : No/denies pain     BMI - recorded: 22.8 Nutritional Status: BMI of 19-24  Normal Nutritional Risks: None Diabetes: No  How often do you need to have someone help you when you read  instructions, pamphlets, or other written materials from your doctor or pharmacy?: 1 - Never  Diabetic?  No  Interpreter Needed?: No  Information entered by :: Theresa Mulligan LPN   Activities of Daily Living    10/21/2022    9:16 AM 10/20/2022   10:19 PM  In your present state of health, do you have any difficulty performing the following activities:  Hearing? 0 0  Vision? 0 0  Difficulty concentrating or making decisions? 0 0  Walking or climbing stairs? 0 0  Dressing or bathing? 0 0  Doing errands, shopping? 0 0  Preparing Food and eating ? N N  Using the Toilet? N N  In the past six months,  have you accidently leaked urine? N N  Do you have problems with loss of bowel control? N N  Managing your Medications? N N  Managing your Finances? N N  Housekeeping or managing your Housekeeping? N N    Patient Care Team: Sheliah Hatch, MD as PCP - General (Family Medicine) Jeani Hawking, MD as Consulting Physician (Gastroenterology) Ola Spurr., MD as Consulting Physician (Obstetrics and Gynecology) Dominica Severin, MD as Consulting Physician (Orthopedic Surgery) Mat Carne, DO as Consulting Physician (Optometry) Puschinsky, Adelfa Koh., MD as Consulting Physician (Urology) Leola Brazil (Dermatology)  Indicate any recent Medical Services you may have received from other than [facility] providers in the past year (date may be approximate).     Assessment:   This is a routine wellness examination for Ahnyla.  Hearing/Vision screen Hearing Screening - Comments:: Denies hearing difficulties   Vision Screening - Comments:: Wears rx glasses - up to date with routine eye exams with  Dr Jimmey Ralph  Dietary issues and exercise activities discussed: Exercise limited by: None identified   Goals Addressed               This Visit's Progress     Patient Stated (pt-stated)        Drink more water & increase activity.       Depression Screen    10/21/2022    9:14 AM  09/10/2022    8:54 AM 03/06/2022   10:16 AM 09/05/2021    8:54 AM 09/04/2021    3:57 PM 09/04/2021    3:55 PM 01/31/2021    9:00 AM  PHQ 2/9 Scores  PHQ - 2 Score 0 0 0 0 0 0 0  PHQ- 9 Score 0 0 0 0   0    Fall Risk    10/21/2022    9:17 AM 10/20/2022   10:19 PM 09/10/2022    8:54 AM 03/06/2022   10:16 AM 09/05/2021    8:54 AM  Fall Risk   Falls in the past year? 1 0 0 0 0  Number falls in past yr: 0 0  0   Injury with Fall? 1 0  0   Comment Fx 2 fingers on rt hand. Followed by medical attention      Risk for fall due to : Impaired balance/gait  No Fall Risks No Fall Risks No Fall Risks  Follow up Falls prevention discussed  Falls evaluation completed Falls evaluation completed Falls evaluation completed    FALL RISK PREVENTION PERTAINING TO THE HOME:  Any stairs in or around the home? No  If so, are there any without handrails? No  Home free of loose throw rugs in walkways, pet beds, electrical cords, etc? Yes  Adequate lighting in your home to reduce risk of falls? Yes   ASSISTIVE DEVICES UTILIZED TO PREVENT FALLS:  Life alert? No  Use of a cane, walker or w/c? No  Grab bars in the bathroom? Yes  Shower chair or bench in shower? No  Elevated toilet seat or a handicapped toilet? Yes   TIMED UP AND GO:  Was the test performed? No . Audio Visit   Cognitive Function:        10/21/2022    9:20 AM  6CIT Screen  What Year? 0 points  What month? 0 points  What time? 0 points  Count back from 20 0 points  Months in reverse 0 points  Repeat phrase 0 points  Total Score 0 points    Immunizations  Immunization History  Administered Date(s) Administered   Fluad Quad(high Dose 65+) 06/07/2019   Influenza Split 09/03/2011   Influenza, High Dose Seasonal PF 06/16/2017, 06/29/2018, 06/07/2019, 07/19/2020, 08/12/2021   Influenza, Seasonal, Injecte, Preservative Fre 09/06/2012   Influenza,inj,Quad PF,6+ Mos 09/25/2013, 05/28/2014, 06/10/2015, 06/10/2016, 06/29/2018    Influenza-Unspecified 07/15/2019   MODERNA COVID-19 SARS-COV-2 PEDS BIVALENT BOOSTER 6Y-11Y 12/04/2020   Moderna Sars-Covid-2 Vaccination 10/19/2019, 11/17/2019, 07/20/2020   Pneumococcal Conjugate-13 05/28/2014   Pneumococcal Polysaccharide-23 06/10/2015   Zoster Recombinat (Shingrix) 06/16/2017, 09/08/2017   Zoster, Live 03/07/2012    TDAP status: Up to date  Flu Vaccine status: Declined, Education has been provided regarding the importance of this vaccine but patient still declined. Advised may receive this vaccine at local pharmacy or Health Dept. Aware to provide a copy of the vaccination record if obtained from local pharmacy or Health Dept. Verbalized acceptance and understanding.  Pneumococcal vaccine status: Up to date  Covid-19 vaccine status: Completed vaccines  Qualifies for Shingles Vaccine? Yes   Zostavax completed Yes   Shingrix Completed?: Yes  Screening Tests Health Maintenance  Topic Date Due   MAMMOGRAM  10/06/2022   INFLUENZA VACCINE  12/27/2022 (Originally 04/28/2022)   COLONOSCOPY (Pts 45-73yrs Insurance coverage will need to be confirmed)  09/29/2023   Medicare Annual Wellness (AWV)  10/22/2023   Pneumonia Vaccine 44+ Years old  Completed   DEXA SCAN  Completed   Hepatitis C Screening  Completed   Zoster Vaccines- Shingrix  Completed   HPV VACCINES  Aged Out   DTaP/Tdap/Td  Discontinued   COVID-19 Vaccine  Discontinued    Health Maintenance  Health Maintenance Due  Topic Date Due   MAMMOGRAM  10/06/2022    Colorectal cancer screening: Referral to GI placed 09/28/13. Pt aware the office will call re: appt.  Mammogram status: Ordered 10/15/22. Pt provided with contact info and advised to call to schedule appt.   Bone Density status: Completed 08/05/20. Results reflect: Bone density results: NORMAL. Repeat every   years.  Lung Cancer Screening: (Low Dose CT Chest recommended if Age 15-80 years, 30 pack-year currently smoking OR have quit w/in 15years.)  does not qualify.     Additional Screening:  Hepatitis C Screening: does qualify; Completed 08/08/20  Vision Screening: Recommended annual ophthalmology exams for early detection of glaucoma and other disorders of the eye. Is the patient up to date with their annual eye exam?  Yes Who is the provider or what is the name of the office in which the patient attends annual eye exams? Dr Jerline Pain If pt is not established with a provider, would they like to be referred to a provider to establish care? No .   Dental Screening: Recommended annual dental exams for proper oral hygiene  Community Resource Referral / Chronic Care Management:  CRR required this visit?  No   CCM required this visit?  No      Plan:     I have personally reviewed and noted the following in the patient's chart:   Medical and social history Use of alcohol, tobacco or illicit drugs  Current medications and supplements including opioid prescriptions. Patient is not currently taking opioid prescriptions. Functional ability and status Nutritional status Physical activity Advanced directives List of other physicians Hospitalizations, surgeries, and ER visits in previous 12 months Vitals Screenings to include cognitive, depression, and falls Referrals and appointments  In addition, I have reviewed and discussed with patient certain preventive protocols, quality metrics, and best practice recommendations. A written personalized  care plan for preventive services as well as general preventive health recommendations were provided to patient.     Criselda Peaches, LPN   01/22/8340   Nurse Notes: None

## 2022-10-21 NOTE — Patient Instructions (Addendum)
Ms. Kristen Huffman , Thank you for taking time to come for your Medicare Wellness Visit. I appreciate your ongoing commitment to your health goals. Please review the following plan we discussed and let me know if I can assist you in the future.   These are the goals we discussed:  Goals       Patient Stated (pt-stated)      Drink more water & increase activity.        This is a list of the screening recommended for you and due dates:  Health Maintenance  Topic Date Due   Mammogram  10/06/2022   Flu Shot  12/27/2022*   Colon Cancer Screening  09/29/2023   Medicare Annual Wellness Visit  10/22/2023   Pneumonia Vaccine  Completed   DEXA scan (bone density measurement)  Completed   Hepatitis C Screening: USPSTF Recommendation to screen - Ages 44-79 yo.  Completed   Zoster (Shingles) Vaccine  Completed   HPV Vaccine  Aged Out   DTaP/Tdap/Td vaccine  Discontinued   COVID-19 Vaccine  Discontinued  *Topic was postponed. The date shown is not the original due date.    Advanced directives: Please bring a copy of your health care power of attorney and living will to the office to be added to your chart at your convenience.   Conditions/risks identified: None  Next appointment: Follow up in one year for your annual wellness visit     Preventive Care 65 Years and Older, Female Preventive care refers to lifestyle choices and visits with your health care provider that can promote health and wellness. What does preventive care include? A yearly physical exam. This is also called an annual well check. Dental exams once or twice a year. Routine eye exams. Ask your health care provider how often you should have your eyes checked. Personal lifestyle choices, including: Daily care of your teeth and gums. Regular physical activity. Eating a healthy diet. Avoiding tobacco and drug use. Limiting alcohol use. Practicing safe sex. Taking low-dose aspirin every day. Taking vitamin and mineral  supplements as recommended by your health care provider. What happens during an annual well check? The services and screenings done by your health care provider during your annual well check will depend on your age, overall health, lifestyle risk factors, and family history of disease. Counseling  Your health care provider may ask you questions about your: Alcohol use. Tobacco use. Drug use. Emotional well-being. Home and relationship well-being. Sexual activity. Eating habits. History of falls. Memory and ability to understand (cognition). Work and work Statistician. Reproductive health. Screening  You may have the following tests or measurements: Height, weight, and BMI. Blood pressure. Lipid and cholesterol levels. These may be checked every 5 years, or more frequently if you are over 34 years old. Skin check. Lung cancer screening. You may have this screening every year starting at age 30 if you have a 30-pack-year history of smoking and currently smoke or have quit within the past 15 years. Fecal occult blood test (FOBT) of the stool. You may have this test every year starting at age 36. Flexible sigmoidoscopy or colonoscopy. You may have a sigmoidoscopy every 5 years or a colonoscopy every 10 years starting at age 16. Hepatitis C blood test. Hepatitis B blood test. Sexually transmitted disease (STD) testing. Diabetes screening. This is done by checking your blood sugar (glucose) after you have not eaten for a while (fasting). You may have this done every 1-3 years. Bone density scan. This is  done to screen for osteoporosis. You may have this done starting at age 42. Mammogram. This may be done every 1-2 years. Talk to your health care provider about how often you should have regular mammograms. Talk with your health care provider about your test results, treatment options, and if necessary, the need for more tests. Vaccines  Your health care provider may recommend certain  vaccines, such as: Influenza vaccine. This is recommended every year. Tetanus, diphtheria, and acellular pertussis (Tdap, Td) vaccine. You may need a Td booster every 10 years. Zoster vaccine. You may need this after age 18. Pneumococcal 13-valent conjugate (PCV13) vaccine. One dose is recommended after age 6. Pneumococcal polysaccharide (PPSV23) vaccine. One dose is recommended after age 34. Talk to your health care provider about which screenings and vaccines you need and how often you need them. This information is not intended to replace advice given to you by your health care provider. Make sure you discuss any questions you have with your health care provider. Document Released: 10/11/2015 Document Revised: 06/03/2016 Document Reviewed: 07/16/2015 Elsevier Interactive Patient Education  2017 [place] Prevention in the Home Falls can cause injuries. They can happen to people of all ages. There are many things you can do to make your home safe and to help prevent falls. What can I do on the outside of my home? Regularly fix the edges of walkways and driveways and fix any cracks. Remove anything that might make you trip as you walk through a door, such as a raised step or threshold. Trim any bushes or trees on the path to your home. Use bright outdoor lighting. Clear any walking paths of anything that might make someone trip, such as rocks or tools. Regularly check to see if handrails are loose or broken. Make sure that both sides of any steps have handrails. Any raised decks and porches should have guardrails on the edges. Have any leaves, snow, or ice cleared regularly. Use sand or salt on walking paths during winter. Clean up any spills in your garage right away. This includes oil or grease spills. What can I do in the bathroom? Use night lights. Install grab bars by the toilet and in the tub and shower. Do not use towel bars as grab bars. Use non-skid mats or decals in  the tub or shower. If you need to sit down in the shower, use a plastic, non-slip stool. Keep the floor dry. Clean up any water that spills on the floor as soon as it happens. Remove soap buildup in the tub or shower regularly. Attach bath mats securely with double-sided non-slip rug tape. Do not have throw rugs and other things on the floor that can make you trip. What can I do in the bedroom? Use night lights. Make sure that you have a light by your bed that is easy to reach. Do not use any sheets or blankets that are too big for your bed. They should not hang down onto the floor. Have a firm chair that has side arms. You can use this for support while you get dressed. Do not have throw rugs and other things on the floor that can make you trip. What can I do in the kitchen? Clean up any spills right away. Avoid walking on wet floors. Keep items that you use a lot in easy-to-reach places. If you need to reach something above you, use a strong step stool that has a grab bar. Keep electrical cords out of  the way. Do not use floor polish or wax that makes floors slippery. If you must use wax, use non-skid floor wax. Do not have throw rugs and other things on the floor that can make you trip. What can I do with my stairs? Do not leave any items on the stairs. Make sure that there are handrails on both sides of the stairs and use them. Fix handrails that are broken or loose. Make sure that handrails are as long as the stairways. Check any carpeting to make sure that it is firmly attached to the stairs. Fix any carpet that is loose or worn. Avoid having throw rugs at the top or bottom of the stairs. If you do have throw rugs, attach them to the floor with carpet tape. Make sure that you have a light switch at the top of the stairs and the bottom of the stairs. If you do not have them, ask someone to add them for you. What else can I do to help prevent falls? Wear shoes that: Do not have high  heels. Have rubber bottoms. Are comfortable and fit you well. Are closed at the toe. Do not wear sandals. If you use a stepladder: Make sure that it is fully opened. Do not climb a closed stepladder. Make sure that both sides of the stepladder are locked into place. Ask someone to hold it for you, if possible. Clearly mark and make sure that you can see: Any grab bars or handrails. First and last steps. Where the edge of each step is. Use tools that help you move around (mobility aids) if they are needed. These include: Canes. Walkers. Scooters. Crutches. Turn on the lights when you go into a dark area. Replace any light bulbs as soon as they burn out. Set up your furniture so you have a clear path. Avoid moving your furniture around. If any of your floors are uneven, fix them. If there are any pets around you, be aware of where they are. Review your medicines with your doctor. Some medicines can make you feel dizzy. This can increase your chance of falling. Ask your doctor what other things that you can do to help prevent falls. This information is not intended to replace advice given to you by your health care provider. Make sure you discuss any questions you have with your health care provider. Document Released: 07/11/2009 Document Revised: 02/20/2016 Document Reviewed: 10/19/2014 Elsevier Interactive Patient Education  2017 [employer].

## 2022-10-27 DIAGNOSIS — M25641 Stiffness of right hand, not elsewhere classified: Secondary | ICD-10-CM | POA: Diagnosis not present

## 2022-11-09 DIAGNOSIS — M25641 Stiffness of right hand, not elsewhere classified: Secondary | ICD-10-CM | POA: Diagnosis not present

## 2022-11-11 DIAGNOSIS — M79641 Pain in right hand: Secondary | ICD-10-CM | POA: Diagnosis not present

## 2022-11-11 DIAGNOSIS — Z4789 Encounter for other orthopedic aftercare: Secondary | ICD-10-CM | POA: Diagnosis not present

## 2022-11-11 DIAGNOSIS — R200 Anesthesia of skin: Secondary | ICD-10-CM | POA: Diagnosis not present

## 2022-12-14 DIAGNOSIS — H9313 Tinnitus, bilateral: Secondary | ICD-10-CM | POA: Diagnosis not present

## 2022-12-14 DIAGNOSIS — H608X1 Other otitis externa, right ear: Secondary | ICD-10-CM | POA: Diagnosis not present

## 2023-01-25 DIAGNOSIS — I83813 Varicose veins of bilateral lower extremities with pain: Secondary | ICD-10-CM | POA: Diagnosis not present

## 2023-01-25 DIAGNOSIS — I781 Nevus, non-neoplastic: Secondary | ICD-10-CM | POA: Diagnosis not present

## 2023-01-25 DIAGNOSIS — I872 Venous insufficiency (chronic) (peripheral): Secondary | ICD-10-CM | POA: Diagnosis not present

## 2023-02-06 ENCOUNTER — Other Ambulatory Visit: Payer: Self-pay | Admitting: Family Medicine

## 2023-03-15 ENCOUNTER — Telehealth: Payer: Self-pay

## 2023-03-15 NOTE — Telephone Encounter (Signed)
Pt called to see if she was due for labs or appt with provider. Confirmed she is due for appt with provider, appt scheduled.

## 2023-03-16 ENCOUNTER — Encounter: Payer: Self-pay | Admitting: Family Medicine

## 2023-03-17 ENCOUNTER — Ambulatory Visit: Payer: Medicare PPO | Admitting: Family Medicine

## 2023-03-17 ENCOUNTER — Encounter: Payer: Self-pay | Admitting: Family Medicine

## 2023-03-17 VITALS — BP 126/82 | HR 62 | Temp 97.8°F | Resp 16 | Ht 65.0 in | Wt 136.4 lb

## 2023-03-17 DIAGNOSIS — J329 Chronic sinusitis, unspecified: Secondary | ICD-10-CM

## 2023-03-17 DIAGNOSIS — E785 Hyperlipidemia, unspecified: Secondary | ICD-10-CM | POA: Diagnosis not present

## 2023-03-17 DIAGNOSIS — B9689 Other specified bacterial agents as the cause of diseases classified elsewhere: Secondary | ICD-10-CM | POA: Diagnosis not present

## 2023-03-17 LAB — HEPATIC FUNCTION PANEL
ALT: 14 U/L (ref 0–35)
AST: 20 U/L (ref 0–37)
Albumin: 4.4 g/dL (ref 3.5–5.2)
Alkaline Phosphatase: 65 U/L (ref 39–117)
Bilirubin, Direct: 0.1 mg/dL (ref 0.0–0.3)
Total Bilirubin: 0.6 mg/dL (ref 0.2–1.2)
Total Protein: 7.1 g/dL (ref 6.0–8.3)

## 2023-03-17 LAB — LIPID PANEL
Cholesterol: 167 mg/dL (ref 0–200)
HDL: 44.8 mg/dL (ref >39.00)
LDL Cholesterol: 97 mg/dL (ref 0–99)
NonHDL: 121.7
Total CHOL/HDL Ratio: 4
Triglycerides: 124 mg/dL (ref 0.0–149.0)
VLDL: 24.8 mg/dL (ref 0.0–40.0)

## 2023-03-17 LAB — BASIC METABOLIC PANEL
BUN: 12 mg/dL (ref 6–23)
CO2: 30 mEq/L (ref 19–32)
Calcium: 9.9 mg/dL (ref 8.4–10.5)
Chloride: 104 mEq/L (ref 96–112)
Creatinine, Ser: 0.72 mg/dL (ref 0.40–1.20)
GFR: 82.2 mL/min (ref >60.00)
Glucose, Bld: 92 mg/dL (ref 70–99)
Potassium: 4.1 mEq/L (ref 3.5–5.1)
Sodium: 142 mEq/L (ref 135–145)

## 2023-03-17 LAB — CBC WITH DIFFERENTIAL/PLATELET
Basophils Absolute: 0.1 10*3/uL (ref 0.0–0.1)
Basophils Relative: 0.9 % (ref 0.0–3.0)
Eosinophils Absolute: 0.1 10*3/uL (ref 0.0–0.7)
Eosinophils Relative: 1.5 % (ref 0.0–5.0)
HCT: 39.5 % (ref 36.0–46.0)
Hemoglobin: 13.1 g/dL (ref 12.0–15.0)
Lymphocytes Relative: 43.1 % (ref 12.0–46.0)
Lymphs Abs: 2.8 10*3/uL (ref 0.7–4.0)
MCHC: 33.1 g/dL (ref 30.0–36.0)
MCV: 85.2 fl (ref 78.0–100.0)
Monocytes Absolute: 0.5 10*3/uL (ref 0.1–1.0)
Monocytes Relative: 7.9 % (ref 3.0–12.0)
Neutro Abs: 3 10*3/uL (ref 1.4–7.7)
Neutrophils Relative %: 46.6 % (ref 43.0–77.0)
Platelets: 223 10*3/uL (ref 150.0–400.0)
RBC: 4.64 Mil/uL (ref 3.87–5.11)
RDW: 13.4 % (ref 11.5–15.5)
WBC: 6.4 10*3/uL (ref 4.0–10.5)

## 2023-03-17 LAB — TSH: TSH: 1.86 u[IU]/mL (ref 0.35–5.50)

## 2023-03-17 MED ORDER — ALPRAZOLAM 0.5 MG PO TABS: 0.5000 mg | ORAL_TABLET | Freq: Two times a day (BID) | ORAL | 1 refills | Status: DC | PRN

## 2023-03-17 MED ORDER — DOXYCYCLINE HYCLATE 100 MG PO TABS
100.0000 mg | ORAL_TABLET | Freq: Two times a day (BID) | ORAL | 0 refills | Status: DC
Start: 2023-03-17 — End: 2023-07-29

## 2023-03-17 MED ORDER — ATORVASTATIN CALCIUM 10 MG PO TABS: 10.0000 mg | ORAL_TABLET | Freq: Every day | ORAL | 1 refills | Status: DC

## 2023-03-17 MED ORDER — AZELASTINE HCL 0.1 % NA SOLN: 1.0000 | Freq: Two times a day (BID) | NASAL | 5 refills | Status: AC

## 2023-03-17 NOTE — Progress Notes (Signed)
   Subjective:    Patient ID: Kristen Huffman, female    DOB: [birth year], 77 y.o.   MRN: [id number]  HPI Hyperlipidemia- chronic problem.  Currently on Atorvastatin 10mg  daily.  Denies CP, SOB, abd pain, N/V.  URI- sxs started ~1 week ago.  Started as excessive nasal congestion and drainage.  Now has maxillary sinus pain/pressure.  Pt started Doxycycline 2 days ago- has taken 4 pills.  No fever.  Bilateral ear fullness.  No tooth pain.  Has a flight to [place] next week   Review of Systems For ROS see HPI     Objective:   Physical Exam Vitals reviewed.  Constitutional:      General: She is not in acute distress.    Appearance: Normal appearance. She is well-developed.  HENT:     Head: Normocephalic and atraumatic.     Right Ear: Tympanic membrane normal.     Left Ear: Tympanic membrane normal.     Nose: Mucosal edema and congestion present. No rhinorrhea.     Right Sinus: Maxillary sinus tenderness and frontal sinus tenderness present.     Left Sinus: Maxillary sinus tenderness and frontal sinus tenderness present.     Mouth/Throat:     Pharynx: Uvula midline. No oropharyngeal exudate or posterior oropharyngeal erythema.  Eyes:     Conjunctiva/sclera: Conjunctivae normal.     Pupils: Pupils are equal, round, and reactive to light.  Cardiovascular:     Rate and Rhythm: Normal rate and regular rhythm.     Heart sounds: Normal heart sounds.  Pulmonary:     Effort: Pulmonary effort is normal. No respiratory distress.     Breath sounds: Normal breath sounds. No wheezing.  Musculoskeletal:     Cervical back: Normal range of motion and neck supple.  Lymphadenopathy:     Cervical: No cervical adenopathy.  Skin:    General: Skin is warm and dry.  Neurological:     General: No focal deficit present.     Mental Status: She is alert and oriented to person, place, and time.     Cranial Nerves: No cranial nerve deficit.     Motor: No weakness.     Coordination: Coordination normal.   Psychiatric:        Mood and Affect: Mood normal.        Behavior: Behavior normal.        Thought Content: Thought content normal.           Assessment & Plan:  Bacterial sinusitis- new.  Pt has already started taking leftover Doxycycline for her sxs.  She has upcoming flight to [place] and is fearful of sxs worsening during the flight and at high altitude in [place].  Complete Doxycycline course as pt has already started.  Reviewed supportive care and red flags that should prompt return.  Pt expressed understanding and is in agreement w/ plan.

## 2023-03-17 NOTE — Assessment & Plan Note (Signed)
Chronic problem.  Tolerating Lipitor 10mg daily w/o difficulty.  Check labs.  Adjust meds prn  ?

## 2023-03-17 NOTE — Patient Instructions (Signed)
Schedule your complete physical in 6 months We'll notify you of your lab results and make any changes if needed TAKE the Doxycycline twice daily as directed- take w/ food START the Azelastin nasal spray TAKE Claritin or Zyrtec daily to help dry things up Call with any questions or concerns [facility] [place]!!!

## 2023-03-18 ENCOUNTER — Telehealth: Payer: Self-pay

## 2023-03-18 NOTE — Telephone Encounter (Signed)
-----   Message from Katherine E Tabori, MD sent at 03/17/2023  5:52 PM EDT ----- Labs look great!  No changes at this time 

## 2023-03-28 DIAGNOSIS — J029 Acute pharyngitis, unspecified: Secondary | ICD-10-CM | POA: Diagnosis not present

## 2023-05-10 DIAGNOSIS — L538 Other specified erythematous conditions: Secondary | ICD-10-CM | POA: Diagnosis not present

## 2023-05-10 DIAGNOSIS — L814 Other melanin hyperpigmentation: Secondary | ICD-10-CM | POA: Diagnosis not present

## 2023-05-10 DIAGNOSIS — X32XXXA Exposure to sunlight, initial encounter: Secondary | ICD-10-CM | POA: Diagnosis not present

## 2023-05-10 DIAGNOSIS — L570 Actinic keratosis: Secondary | ICD-10-CM | POA: Diagnosis not present

## 2023-05-10 DIAGNOSIS — L298 Other pruritus: Secondary | ICD-10-CM | POA: Diagnosis not present

## 2023-05-10 DIAGNOSIS — L578 Other skin changes due to chronic exposure to nonionizing radiation: Secondary | ICD-10-CM | POA: Diagnosis not present

## 2023-05-10 DIAGNOSIS — L820 Inflamed seborrheic keratosis: Secondary | ICD-10-CM | POA: Diagnosis not present

## 2023-05-10 DIAGNOSIS — D225 Melanocytic nevi of trunk: Secondary | ICD-10-CM | POA: Diagnosis not present

## 2023-05-10 DIAGNOSIS — L821 Other seborrheic keratosis: Secondary | ICD-10-CM | POA: Diagnosis not present

## 2023-05-14 ENCOUNTER — Encounter: Payer: Self-pay | Admitting: Family Medicine

## 2023-06-22 IMAGING — MG MM DIGITAL SCREENING BILAT W/ TOMO AND CAD
8 series · 9 of 24 positions shown · non-contrast
Comparison: Previous exam(s).

CLINICAL DATA: Screening.

EXAM:
DIGITAL SCREENING BILATERAL MAMMOGRAM WITH TOMOSYNTHESIS AND CAD
TECHNIQUE: Bilateral screening digital craniocaudal and mediolateral oblique
mammograms were obtained. Bilateral screening digital breast
tomosynthesis was performed. The images were evaluated with
computer-aided detection.

[R CC synth-2D]
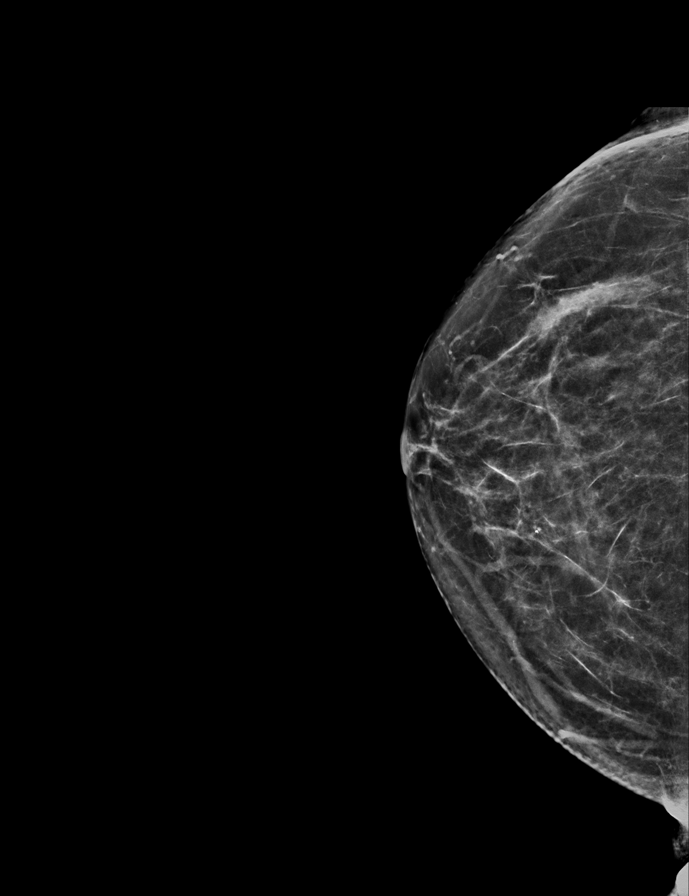

[L CC synth-2D]
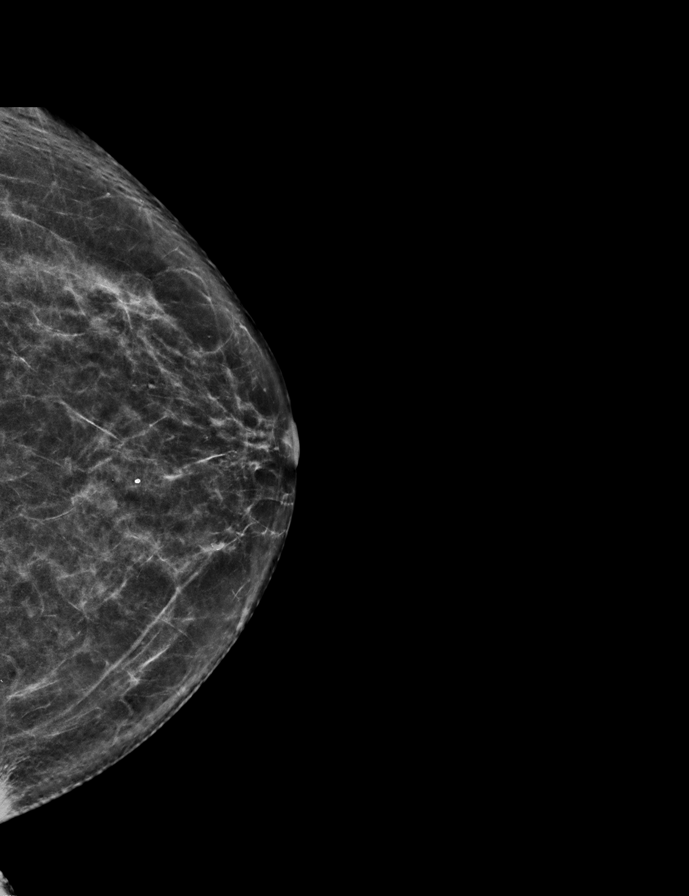

[R MLO synth-2D]
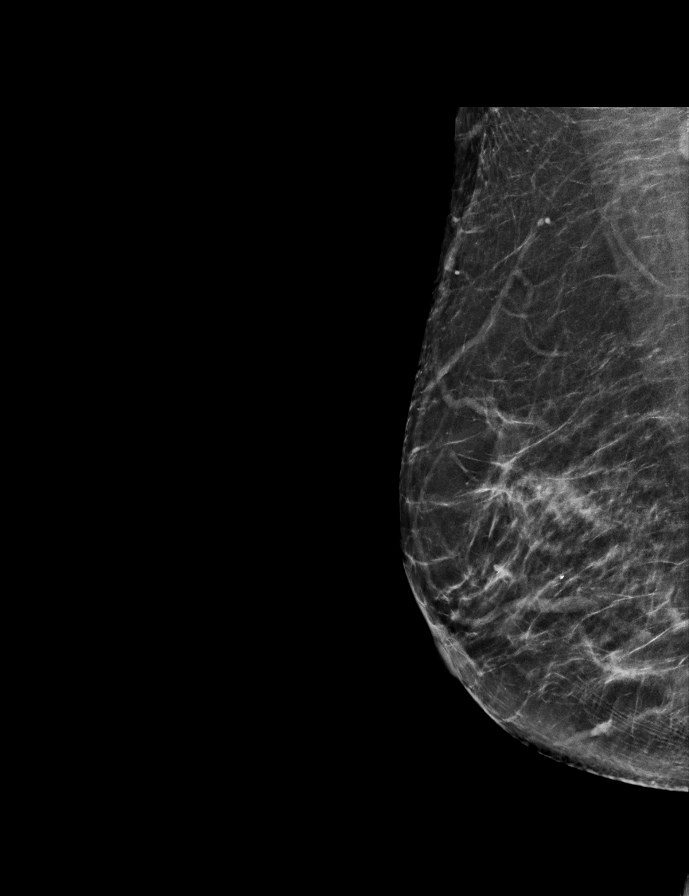

[L MLO synth-2D]
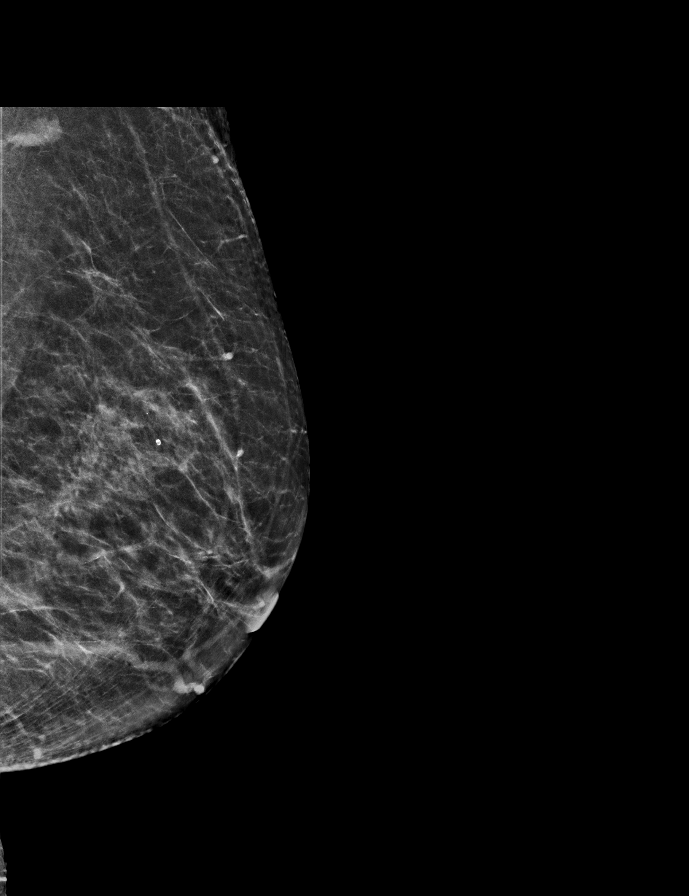

[L CC tomo · 2 of 61 frames shown]
[frame 20/61]
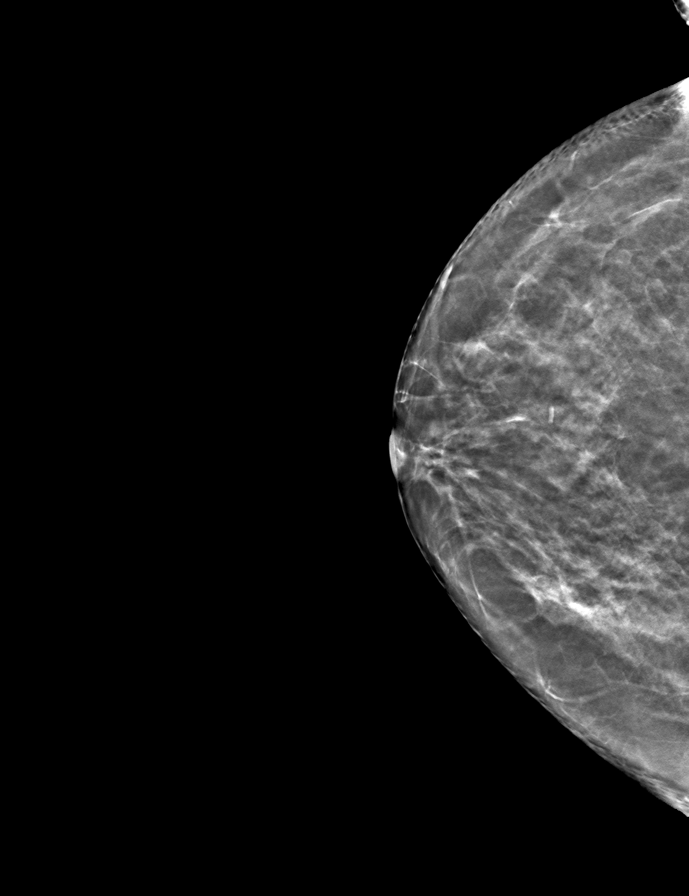
[frame 31/61]
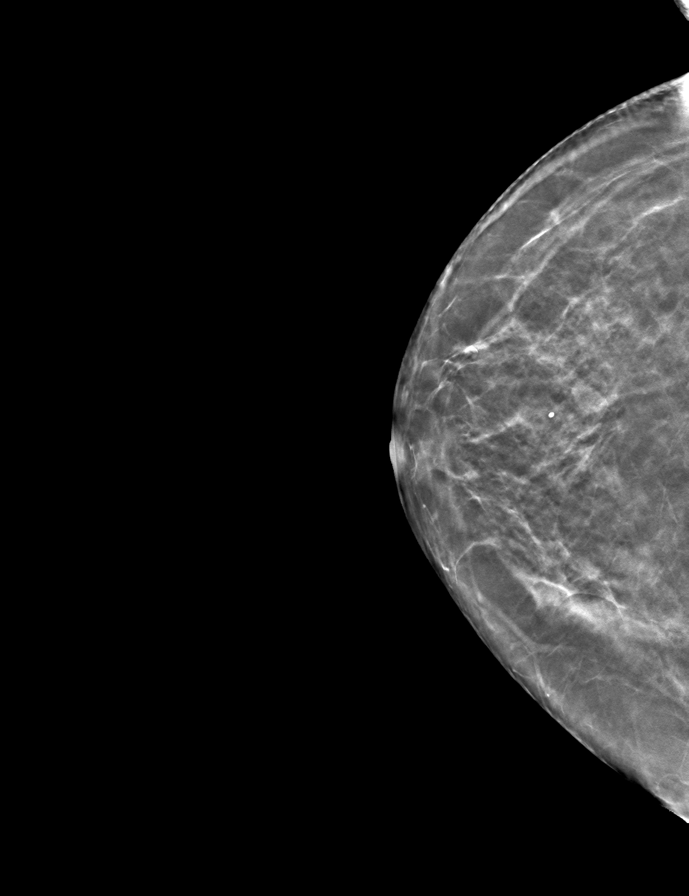

[L MLO tomo · tomo slice 31/61.0]
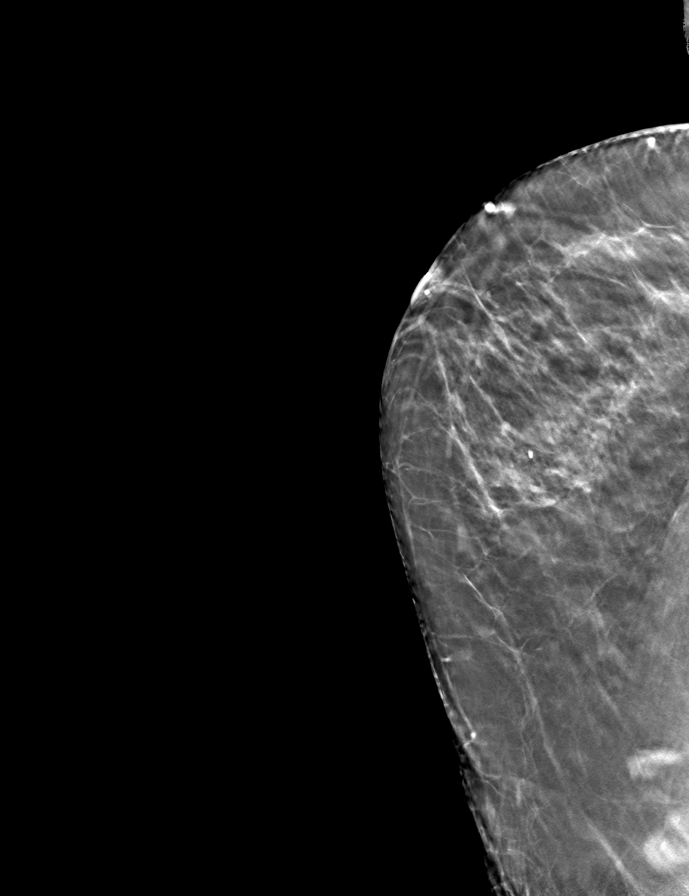

[R MLO tomo · tomo slice 31/61.0]
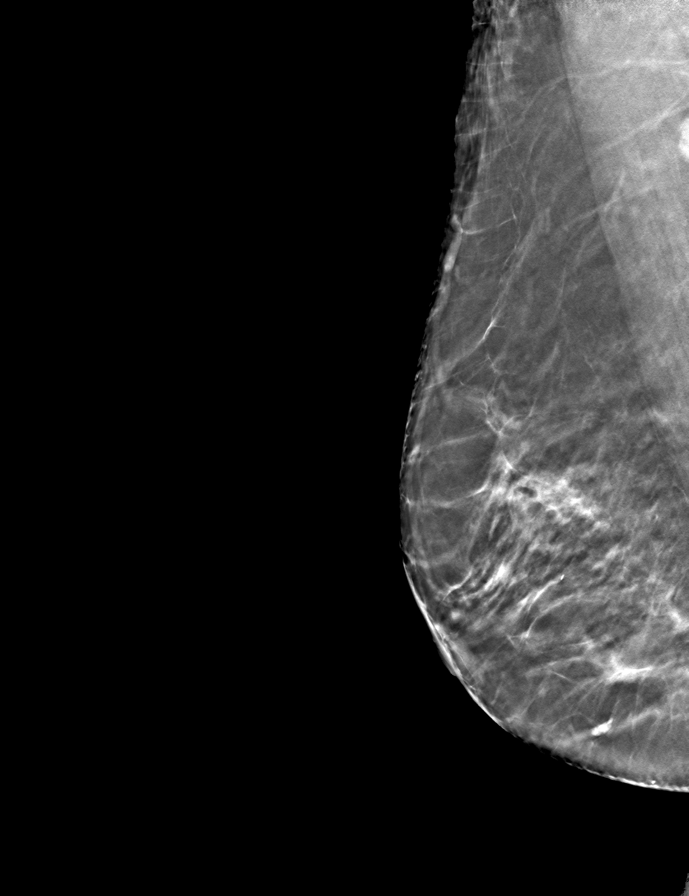

[R CC tomo · tomo slice 31/60.0]
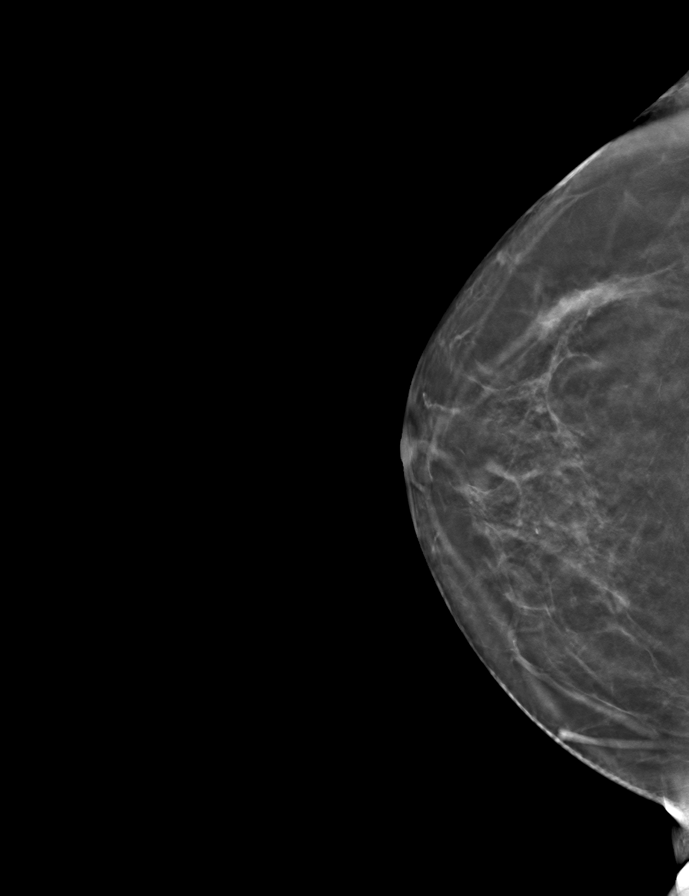

[9 of 24 positions shown; findings below may reference images not displayed]

ACR Breast Density Category b: There are scattered areas of
fibroglandular density.
FINDINGS: There are no findings suspicious for malignancy.
IMPRESSION: No mammographic evidence of malignancy. A result letter of this
screening mammogram will be mailed directly to the patient.

RECOMMENDATION:
Screening mammogram in one year. (Code:51-O-LD2)

BI-RADS CATEGORY  1: Negative.

## 2023-07-29 ENCOUNTER — Encounter: Payer: Self-pay | Admitting: Family Medicine

## 2023-07-29 ENCOUNTER — Ambulatory Visit: Payer: Medicare PPO | Admitting: Family Medicine

## 2023-07-29 VITALS — BP 144/82 | HR 58 | Temp 97.7°F | Ht 65.0 in | Wt 137.2 lb

## 2023-07-29 DIAGNOSIS — R399 Unspecified symptoms and signs involving the genitourinary system: Secondary | ICD-10-CM

## 2023-07-29 DIAGNOSIS — G4700 Insomnia, unspecified: Secondary | ICD-10-CM | POA: Diagnosis not present

## 2023-07-29 DIAGNOSIS — R8290 Unspecified abnormal findings in urine: Secondary | ICD-10-CM

## 2023-07-29 LAB — POCT URINALYSIS DIPSTICK
Bilirubin, UA: NEGATIVE
Blood, UA: NEGATIVE
Glucose, UA: NEGATIVE
Ketones, UA: NEGATIVE
Leukocytes, UA: NEGATIVE
Nitrite, UA: NEGATIVE
Protein, UA: NEGATIVE
Spec Grav, UA: 1.015 (ref 1.010–1.025)
Urobilinogen, UA: 0.2 U/dL
pH, UA: 6.5 (ref 5.0–8.0)

## 2023-07-29 NOTE — Progress Notes (Signed)
   Subjective:    Patient ID: Kristen Huffman, female    DOB: 12-04-1945, 77 y.o.   MRN: [id number]  HPI Urinary odor- sxs started a few weeks ago.  No blood.  No burning w/ urination.  + urgency.  + suprapubic pressure but this has resolved.  No longer feeling as bloated or uncomfortable.  Admits to limited water intake and typically urine is 'pretty dark'.  Insomnia- pt reports being able to fall asleep but not being able to stay asleep.  Wakes up and mind will start racing.  She lives alone and is hesitant to start medication.  Doesn't feel overly tired.   Review of Systems For ROS see HPI     Objective:   Physical Exam Vitals reviewed.  Constitutional:      General: She is not in acute distress.    Appearance: Normal appearance. She is not ill-appearing.  HENT:     Head: Normocephalic and atraumatic.  Eyes:     Extraocular Movements: Extraocular movements intact.     Conjunctiva/sclera: Conjunctivae normal.  Cardiovascular:     Rate and Rhythm: Normal rate and regular rhythm.  Pulmonary:     Effort: Pulmonary effort is normal. No respiratory distress.  Abdominal:     General: There is no distension.     Tenderness: There is no abdominal tenderness. There is no right CVA tenderness, left CVA tenderness, guarding or rebound.  Skin:    General: Skin is warm and dry.  Neurological:     General: No focal deficit present.     Mental Status: She is alert and oriented to person, place, and time.  Psychiatric:        Mood and Affect: Mood normal.        Behavior: Behavior normal.        Thought Content: Thought content normal.           Assessment & Plan:  Urine odor- new.  UA WNL.  No other sxs of UTI.  Discussed that urine gets very concentrated if not drinking water and will have a more pungent odor.  Encouraged increased water intake and she will let me know if sxs return or change.  Insomnia- new to provider, ongoing for pt.  At this time we discussed medication options  but pt is very hesitant to take anything since she lives alone and wants to be able to get up if needed.  If pt changes her mind, will start Trazodone.  Pt expressed understanding and is in agreement w/ plan.

## 2023-07-29 NOTE — Patient Instructions (Signed)
Follow up as needed or as scheduled INCREASE your water intake Make sure you are eating regularly Call with any questions or concerns Stay Safe!  Stay Healthy! Happy Fall!!!

## 2023-08-23 DIAGNOSIS — H25813 Combined forms of age-related cataract, bilateral: Secondary | ICD-10-CM | POA: Diagnosis not present

## 2023-08-23 DIAGNOSIS — H5203 Hypermetropia, bilateral: Secondary | ICD-10-CM | POA: Diagnosis not present

## 2023-08-23 DIAGNOSIS — H43813 Vitreous degeneration, bilateral: Secondary | ICD-10-CM | POA: Diagnosis not present

## 2023-08-23 DIAGNOSIS — H04123 Dry eye syndrome of bilateral lacrimal glands: Secondary | ICD-10-CM | POA: Diagnosis not present

## 2023-08-23 DIAGNOSIS — H524 Presbyopia: Secondary | ICD-10-CM | POA: Diagnosis not present

## 2023-08-23 DIAGNOSIS — H35721 Serous detachment of retinal pigment epithelium, right eye: Secondary | ICD-10-CM | POA: Diagnosis not present

## 2023-08-23 DIAGNOSIS — H52223 Regular astigmatism, bilateral: Secondary | ICD-10-CM | POA: Diagnosis not present

## 2023-09-16 ENCOUNTER — Ambulatory Visit: Payer: Medicare PPO | Admitting: Family Medicine

## 2023-10-04 ENCOUNTER — Ambulatory Visit: Payer: Medicare PPO | Admitting: Family Medicine

## 2023-10-06 ENCOUNTER — Encounter: Payer: Self-pay | Admitting: Family Medicine

## 2023-10-06 ENCOUNTER — Ambulatory Visit: Payer: Medicare PPO | Admitting: Family Medicine

## 2023-10-06 VITALS — BP 134/72 | HR 62 | Temp 97.8°F | Ht 65.0 in | Wt 137.2 lb

## 2023-10-06 DIAGNOSIS — E785 Hyperlipidemia, unspecified: Secondary | ICD-10-CM

## 2023-10-06 DIAGNOSIS — Z0000 Encounter for general adult medical examination without abnormal findings: Secondary | ICD-10-CM

## 2023-10-06 LAB — BASIC METABOLIC PANEL
BUN: 12 mg/dL (ref 6–23)
CO2: 32 meq/L (ref 19–32)
Calcium: 9.8 mg/dL (ref 8.4–10.5)
Chloride: 105 meq/L (ref 96–112)
Creatinine, Ser: 0.7 mg/dL (ref 0.40–1.20)
GFR: 84.7 mL/min (ref >60.00)
Glucose, Bld: 84 mg/dL (ref 70–99)
Potassium: 4.3 meq/L (ref 3.5–5.1)
Sodium: 142 meq/L (ref 135–145)

## 2023-10-06 LAB — CBC WITH DIFFERENTIAL/PLATELET
Basophils Absolute: 0.1 10*3/uL (ref 0.0–0.1)
Basophils Relative: 1.2 % (ref 0.0–3.0)
Eosinophils Absolute: 0.1 10*3/uL (ref 0.0–0.7)
Eosinophils Relative: 1.9 % (ref 0.0–5.0)
HCT: 41.5 % (ref 36.0–46.0)
Hemoglobin: 13.6 g/dL (ref 12.0–15.0)
Lymphocytes Relative: 45.5 % (ref 12.0–46.0)
Lymphs Abs: 2.7 10*3/uL (ref 0.7–4.0)
MCHC: 32.7 g/dL (ref 30.0–36.0)
MCV: 87.1 fL (ref 78.0–100.0)
Monocytes Absolute: 0.5 10*3/uL (ref 0.1–1.0)
Monocytes Relative: 8.9 % (ref 3.0–12.0)
Neutro Abs: 2.5 10*3/uL (ref 1.4–7.7)
Neutrophils Relative %: 42.5 % — ABNORMAL LOW (ref 43.0–77.0)
Platelets: 216 10*3/uL (ref 150.0–400.0)
RBC: 4.76 Mil/uL (ref 3.87–5.11)
RDW: 14 % (ref 11.5–15.5)
WBC: 5.9 10*3/uL (ref 4.0–10.5)

## 2023-10-06 LAB — LIPID PANEL
Cholesterol: 179 mg/dL (ref 0–200)
HDL: 48.8 mg/dL (ref >39.00)
LDL Cholesterol: 105 mg/dL — ABNORMAL HIGH (ref 0–99)
NonHDL: 130.62
Total CHOL/HDL Ratio: 4
Triglycerides: 127 mg/dL (ref 0.0–149.0)
VLDL: 25.4 mg/dL (ref 0.0–40.0)

## 2023-10-06 LAB — HEPATIC FUNCTION PANEL
ALT: 14 U/L (ref 0–35)
AST: 19 U/L (ref 0–37)
Albumin: 4.4 g/dL (ref 3.5–5.2)
Alkaline Phosphatase: 66 U/L (ref 39–117)
Bilirubin, Direct: 0.1 mg/dL (ref 0.0–0.3)
Total Bilirubin: 0.5 mg/dL (ref 0.2–1.2)
Total Protein: 6.8 g/dL (ref 6.0–8.3)

## 2023-10-06 LAB — TSH: TSH: 1.96 u[IU]/mL (ref 0.35–5.50)

## 2023-10-06 NOTE — Progress Notes (Signed)
   Subjective:    Patient ID: Kristen Huffman, female    DOB: May 22, 1946, 78 y.o.   MRN: [id number]  HPI CPE- pt declines flu, COVID, Tdap vaccines.  UTD on mammogram.  No longer doing colonoscopy.  Patient Care Team    Relationship Specialty Notifications Start End  Mahlon Comer BRAVO, MD PCP - General Family Medicine  11/13/11   Rollin Dover, MD Consulting Physician Gastroenterology  06/07/15   Teresa Manuelita RAMAN., MD Consulting Physician Obstetrics and Gynecology  06/07/15   Camella Fallow, MD Consulting Physician Orthopedic Surgery  06/07/15   Kennyth Cy RAMAN, DO Consulting Physician Optometry  06/07/15   Puschinsky, Charlie ORN., MD Consulting Physician Urology  06/10/15   Hugh Ivanoff  Dermatology  06/16/17     Health Maintenance  Topic Date Due   DTaP/Tdap/Td (1 - Tdap) Never done   INFLUENZA VACCINE  04/29/2023   COVID-19 Vaccine (5 - 2024-25 season) 05/30/2023   Colonoscopy  09/29/2023   Medicare Annual Wellness (AWV)  10/22/2023   MAMMOGRAM  10/21/2023   Pneumonia Vaccine 27+ Years old  Completed   DEXA SCAN  Completed   Hepatitis C Screening  Completed   Zoster Vaccines- Shingrix  Completed   HPV VACCINES  Aged Out     Review of Systems Patient reports no vision/ hearing changes, adenopathy,fever, weight change,  persistant/recurrent hoarseness , swallowing issues, chest pain, palpitations, edema, persistant/recurrent cough, hemoptysis, dyspnea (rest/exertional/paroxysmal nocturnal), gastrointestinal bleeding (melena, rectal bleeding), abdominal pain, significant heartburn, bowel changes, GU symptoms (dysuria, hematuria, incontinence), Gyn symptoms (abnormal  bleeding, pain),  syncope, focal weakness, memory loss, numbness & tingling, skin/hair/nail changes, abnormal bruising or bleeding, anxiety, or depression.     Objective:   Physical Exam General Appearance:    Alert, cooperative, no distress, appears stated age  Head:    Normocephalic, without obvious abnormality, atraumatic  Eyes:     PERRL, conjunctiva/corneas clear, EOM's intact both eyes  Ears:    Normal TM's and external ear canals, both ears  Nose:   Nares normal, septum midline, mucosa normal, no drainage    or sinus tenderness  Throat:   Lips, mucosa, and tongue normal; teeth and gums normal  Neck:   Supple, symmetrical, trachea midline, no adenopathy;    Thyroid : no enlargement/tenderness/nodules  Back:     Symmetric, no curvature, ROM normal, no CVA tenderness  Lungs:     Clear to auscultation bilaterally, respirations unlabored  Chest Wall:    No tenderness or deformity   Heart:    Regular rate and rhythm, S1 and S2 normal, no murmur, rub   or gallop  Breast Exam:    Deferred to mammo  Abdomen:     Soft, non-tender, bowel sounds active all four quadrants,    no masses, no organomegaly  Genitalia:    Deferred  Rectal:    Extremities:   Extremities normal, atraumatic, no cyanosis or edema  Pulses:   2+ and symmetric all extremities  Skin:   Skin color, texture, turgor normal, no rashes or lesions  Lymph nodes:   Cervical, supraclavicular, and axillary nodes normal  Neurologic:   CNII-XII intact, normal strength, sensation and reflexes    throughout          Assessment & Plan:

## 2023-10-06 NOTE — Patient Instructions (Addendum)
 Follow up in 6 months to recheck cholesterol We'll notify you of your lab results and make any changes if needed Keep up the good work on healthy diet and regular exercise- you look great! Ask Dr Rollin if you need to have a repeat colonoscopy now that you're 77 Call Urology and ask if you need to do anything w/ the pessary Call with any questions or concerns Stay Safe!!  Stay Healthy! Happy New Year!!!

## 2023-10-06 NOTE — Assessment & Plan Note (Signed)
 Pt's PE WNL.  UTD on mammo.  Refusing flu and Tdap.  Will call Dr Elnoria Howard to see if another colonoscopy is needed and call urology to determine if anything needs to be done w/ her pessary.  Check labs.  Anticipatory guidance provided.

## 2023-10-07 ENCOUNTER — Telehealth: Payer: Self-pay

## 2023-10-07 NOTE — Telephone Encounter (Signed)
 Pt has reviewed via MyChart

## 2023-10-07 NOTE — Telephone Encounter (Signed)
-----   Message from Neena Rhymes sent at 10/07/2023  7:32 AM EST ----- Labs look great!  No changes at this time

## 2023-11-09 ENCOUNTER — Telehealth: Payer: Self-pay

## 2023-11-09 NOTE — Telephone Encounter (Signed)
Called and got patient scheduled for virtual tomorrow morning, denied seeing other physician today.  No other concerns

## 2023-11-09 NOTE — Telephone Encounter (Signed)
Copied from CRM [id number]. Topic: Clinical - Medical Advice >> Nov 09, 2023 11:19 AM Chantha C wrote: Reason for CRM: Patient is sick, nasal congestion, nasal drainage, constantly blowing nose and headaches in the morrning. Patient has been taking cold and allergy medicine from [facility] and it's not helping. Patient denies a fever, coughing, shortness of breath, wheezing, or pain.  Patient does not want to come in, she lives an hour away and want meds sent to [id number] - [place], [place] -[address] [place] [address] Phone:[phone] Please advise [phone].

## 2023-11-10 ENCOUNTER — Telehealth: Payer: Medicare PPO | Admitting: Family Medicine

## 2023-11-10 ENCOUNTER — Encounter: Payer: Self-pay | Admitting: Family Medicine

## 2023-11-10 DIAGNOSIS — J019 Acute sinusitis, unspecified: Secondary | ICD-10-CM | POA: Diagnosis not present

## 2023-11-10 MED ORDER — PREDNISONE 10 MG PO TABS: ORAL_TABLET | ORAL | 0 refills | Status: DC

## 2023-11-10 NOTE — Progress Notes (Signed)
Virtual Visit via Video   I connected with patient on 11/10/23 at  8:20 AM EST by a video enabled telemedicine application and verified that I am speaking with the correct person using two identifiers.  Location patient: Home Location provider: [facility], Office Persons participating in the virtual visit: Patient, Provider, CMA Archie Patten H)  I discussed the limitations of evaluation and management by telemedicine and the availability of in person appointments. The patient expressed understanding and agreed to proceed.  Subjective:   HPI:   URI- 'my head is just so congested'.  Sxs started 1 week go w/ nasal congestion.  Took OTC Allergy/Cold pills w/ some relief.  + HA.  No ear pain.  Denies facial pain/pressure.  Drainage is clear.  ROS:   See pertinent positives and negatives per HPI.  Patient Active Problem List   Diagnosis Date Noted   Microscopic hematuria 03/06/2022   Postmenopausal estrogen deficiency 12/02/2015   Physical exam 03/07/2012   Hyperlipidemia 02/10/2011   Allergic rhinitis 02/10/2011    Social History   Tobacco Use   Smoking status: Former    Current packs/day: 0.00    Average packs/day: 0.5 packs/day for 15.0 years (7.5 ttl pk-yrs)    Types: Cigarettes    Start date: 09/28/1964    Quit date: 09/29/1979    Years since quitting: 44.1   Smokeless tobacco: Never  Substance Use Topics   Alcohol use: No    Alcohol/week: 0.0 standard drinks of alcohol    Current Outpatient Medications:    ALPRAZolam (XANAX) 0.5 MG tablet, Take 1 tablet (0.5 mg total) by mouth 2 (two) times daily as needed for anxiety., Disp: 30 tablet, Rfl: 1   Ascorbic Acid (VITAMIN C) 100 MG tablet, Take 100 mg by mouth daily., Disp: , Rfl:    atorvastatin (LIPITOR) 10 MG tablet, Take 1 tablet (10 mg total) by mouth daily., Disp: 90 tablet, Rfl: 1   azelastine (ASTELIN) 0.1 % nasal spray, Place 1 spray into both nostrils 2 (two) times daily. Use in each nostril as directed,  Disp: 30 mL, Rfl: 5   Biotin 300 MCG TABS, Take 400 mcg by mouth., Disp: , Rfl:    cetirizine (ZYRTEC) 10 MG tablet, Take 10 mg by mouth daily., Disp: , Rfl:    cholecalciferol (VITAMIN D3) 25 MCG (1000 UNIT) tablet, Take 100 Units by mouth daily., Disp: , Rfl:    Cyanocobalamin (VITAMIN B-12 PO), Take 13.5 mg by mouth., Disp: , Rfl:    Multiple Vitamin (MULTIVITAMIN) tablet, Take 1 tablet by mouth daily., Disp: , Rfl:    thiamine 50 MG tablet, Take 4 mg by mouth daily., Disp: , Rfl:    VITAMIN A PO, Take 50 mg by mouth daily., Disp: , Rfl:    Vitamin E 100 units TABS, Take 10 mg by mouth daily., Disp: , Rfl:    vitamin k 100 MCG tablet, Take 100 mcg by mouth daily., Disp: , Rfl:    Zinc Sulfate (ZINC 15 PO), Take 16 mg by mouth., Disp: , Rfl:   Allergies  Allergen Reactions   Tetanus Toxoids     Pt notes mother almost died and was advised her children never get this injection   Codeine Other (See Comments)    UNKNOWN - WAS AS A CHILD   Novocain [Procaine] Itching, Swelling and Rash   Penicillins Rash    Objective:   There were no vitals taken for this visit. AAOx3, NAD NCAT, EOMI No obvious CN deficits  Coloring WNL Pt is able to speak clearly, coherently without shortness of breath or increased work of breathing.  Thought process is linear.  Mood is appropriate.   Assessment and Plan:   Sinusitis- new.  Thankfully just inflammation and not infxn.  Start Prednisone taper.  No need for abx.  Reviewed supportive care and red flags that should prompt return.  Pt expressed understanding and is in agreement w/ plan.    Neena Rhymes, MD 11/10/2023

## 2023-11-15 DIAGNOSIS — M19011 Primary osteoarthritis, right shoulder: Secondary | ICD-10-CM | POA: Diagnosis not present

## 2023-11-22 ENCOUNTER — Other Ambulatory Visit (HOSPITAL_BASED_OUTPATIENT_CLINIC_OR_DEPARTMENT_OTHER): Payer: Self-pay | Admitting: Family Medicine

## 2023-11-22 ENCOUNTER — Telehealth: Payer: Self-pay | Admitting: Family Medicine

## 2023-11-22 DIAGNOSIS — Z780 Asymptomatic menopausal state: Secondary | ICD-10-CM

## 2023-11-22 DIAGNOSIS — Z1231 Encounter for screening mammogram for malignant neoplasm of breast: Secondary | ICD-10-CM

## 2023-11-22 NOTE — Telephone Encounter (Signed)
 Copied from CRM [id number]. Topic: Referral - Request for Referral >> Nov 22, 2023 10:22 AM Marica Otter wrote:  Did the patient discuss referral with their provider in the last year? Yes (If No - schedule appointment) (If Yes - send message)  Appointment offered? No  Type of order/referral and detailed reason for visit: Bone Density Test  Preference of office, provider, location: [facility]  If referral order, have you been seen by this specialty before? Yes, [facility] 2021 (If Yes, this issue or another issue? When? Where?  Can we respond through MyChart? Yes

## 2023-11-23 NOTE — Telephone Encounter (Signed)
 Patients last scan was in 2021 but is noted as complete, should patient have repeat scan?

## 2023-11-24 ENCOUNTER — Other Ambulatory Visit: Payer: Self-pay | Admitting: Family Medicine

## 2023-11-30 ENCOUNTER — Ambulatory Visit (INDEPENDENT_AMBULATORY_CARE_PROVIDER_SITE_OTHER): Payer: Medicare PPO | Admitting: *Deleted

## 2023-11-30 DIAGNOSIS — Z0000 Encounter for general adult medical examination without abnormal findings: Secondary | ICD-10-CM | POA: Diagnosis not present

## 2023-11-30 DIAGNOSIS — Z1211 Encounter for screening for malignant neoplasm of colon: Secondary | ICD-10-CM | POA: Diagnosis not present

## 2023-11-30 NOTE — Patient Instructions (Signed)
 Kristen Huffman , Thank you for taking time to come for your Medicare Wellness Visit. I appreciate your ongoing commitment to your health goals. Please review the following plan we discussed and let me know if I can assist you in the future.   Screening recommendations/referrals: Colonoscopy: cologuard ordered Mammogram: up to date Bone Density: Education provided Recommended yearly ophthalmology/optometry visit for glaucoma screening and checkup Recommended yearly dental visit for hygiene and checkup  Vaccinations: Influenza vaccine:  Pneumococcal vaccine: up to date Tdap vaccine: Education provided      Preventive Care 65 Years and Older, Female Preventive care refers to lifestyle choices and visits with your health care provider that can promote health and wellness. What does preventive care include? A yearly physical exam. This is also called an annual well check. Dental exams once or twice a year. Routine eye exams. Ask your health care provider how often you should have your eyes checked. Personal lifestyle choices, including: Daily care of your teeth and gums. Regular physical activity. Eating a healthy diet. Avoiding tobacco and drug use. Limiting alcohol use. Practicing safe sex. Taking low-dose aspirin every day. Taking vitamin and mineral supplements as recommended by your health care provider. What happens during an annual well check? The services and screenings done by your health care provider during your annual well check will depend on your age, overall health, lifestyle risk factors, and family history of disease. Counseling  Your health care provider may ask you questions about your: Alcohol use. Tobacco use. Drug use. Emotional well-being. Home and relationship well-being. Sexual activity. Eating habits. History of falls. Memory and ability to understand (cognition). Work and work Astronomer. Reproductive health. Screening  You may have the following tests  or measurements: Height, weight, and BMI. Blood pressure. Lipid and cholesterol levels. These may be checked every 5 years, or more frequently if you are over 53 years old. Skin check. Lung cancer screening. You may have this screening every year starting at age 18 if you have a 30-pack-year history of smoking and currently smoke or have quit within the past 15 years. Fecal occult blood test (FOBT) of the stool. You may have this test every year starting at age 73. Flexible sigmoidoscopy or colonoscopy. You may have a sigmoidoscopy every 5 years or a colonoscopy every 10 years starting at age 55. Hepatitis C blood test. Hepatitis B blood test. Sexually transmitted disease (STD) testing. Diabetes screening. This is done by checking your blood sugar (glucose) after you have not eaten for a while (fasting). You may have this done every 1-3 years. Bone density scan. This is done to screen for osteoporosis. You may have this done starting at age 19. Mammogram. This may be done every 1-2 years. Talk to your health care provider about how often you should have regular mammograms. Talk with your health care provider about your test results, treatment options, and if necessary, the need for more tests. Vaccines  Your health care provider may recommend certain vaccines, such as: Influenza vaccine. This is recommended every year. Tetanus, diphtheria, and acellular pertussis (Tdap, Td) vaccine. You may need a Td booster every 10 years. Zoster vaccine. You may need this after age 73. Pneumococcal 13-valent conjugate (PCV13) vaccine. One dose is recommended after age 37. Pneumococcal polysaccharide (PPSV23) vaccine. One dose is recommended after age 27. Talk to your health care provider about which screenings and vaccines you need and how often you need them. This information is not intended to replace advice given to you  by your health care provider. Make sure you discuss any questions you have with your  health care provider. Document Released: 10/11/2015 Document Revised: 06/03/2016 Document Reviewed: 07/16/2015 Elsevier Interactive Patient Education  2017 [employer].  Fall Prevention in the Home Falls can cause injuries. They can happen to people of all ages. There are many things you can do to make your home safe and to help prevent falls. What can I do on the outside of my home? Regularly fix the edges of walkways and driveways and fix any cracks. Remove anything that might make you trip as you walk through a door, such as a raised step or threshold. Trim any bushes or trees on the path to your home. Use bright outdoor lighting. Clear any walking paths of anything that might make someone trip, such as rocks or tools. Regularly check to see if handrails are loose or broken. Make sure that both sides of any steps have handrails. Any raised decks and porches should have guardrails on the edges. Have any leaves, snow, or ice cleared regularly. Use sand or salt on walking paths during winter. Clean up any spills in your garage right away. This includes oil or grease spills. What can I do in the bathroom? Use night lights. Install grab bars by the toilet and in the tub and shower. Do not use towel bars as grab bars. Use non-skid mats or decals in the tub or shower. If you need to sit down in the shower, use a plastic, non-slip stool. Keep the floor dry. Clean up any water that spills on the floor as soon as it happens. Remove soap buildup in the tub or shower regularly. Attach bath mats securely with double-sided non-slip rug tape. Do not have throw rugs and other things on the floor that can make you trip. What can I do in the bedroom? Use night lights. Make sure that you have a light by your bed that is easy to reach. Do not use any sheets or blankets that are too big for your bed. They should not hang down onto the floor. Have a firm chair that has side arms. You can use this for  support while you get dressed. Do not have throw rugs and other things on the floor that can make you trip. What can I do in the kitchen? Clean up any spills right away. Avoid walking on wet floors. Keep items that you use a lot in easy-to-reach places. If you need to reach something above you, use a strong step stool that has a grab bar. Keep electrical cords out of the way. Do not use floor polish or wax that makes floors slippery. If you must use wax, use non-skid floor wax. Do not have throw rugs and other things on the floor that can make you trip. What can I do with my stairs? Do not leave any items on the stairs. Make sure that there are handrails on both sides of the stairs and use them. Fix handrails that are broken or loose. Make sure that handrails are as long as the stairways. Check any carpeting to make sure that it is firmly attached to the stairs. Fix any carpet that is loose or worn. Avoid having throw rugs at the top or bottom of the stairs. If you do have throw rugs, attach them to the floor with carpet tape. Make sure that you have a light switch at the top of the stairs and the bottom of the stairs. If  you do not have them, ask someone to add them for you. What else can I do to help prevent falls? Wear shoes that: Do not have high heels. Have rubber bottoms. Are comfortable and fit you well. Are closed at the toe. Do not wear sandals. If you use a stepladder: Make sure that it is fully opened. Do not climb a closed stepladder. Make sure that both sides of the stepladder are locked into place. Ask someone to hold it for you, if possible. Clearly mark and make sure that you can see: Any grab bars or handrails. First and last steps. Where the edge of each step is. Use tools that help you move around (mobility aids) if they are needed. These include: Canes. Walkers. Scooters. Crutches. Turn on the lights when you go into a dark area. Replace any light bulbs as soon  as they burn out. Set up your furniture so you have a clear path. Avoid moving your furniture around. If any of your floors are uneven, fix them. If there are any pets around you, be aware of where they are. Review your medicines with your doctor. Some medicines can make you feel dizzy. This can increase your chance of falling. Ask your doctor what other things that you can do to help prevent falls. This information is not intended to replace advice given to you by your health care provider. Make sure you discuss any questions you have with your health care provider. Document Released: 07/11/2009 Document Revised: 02/20/2016 Document Reviewed: 10/19/2014 Elsevier Interactive Patient Education  2017 [employer].

## 2023-11-30 NOTE — Telephone Encounter (Signed)
 Called patient and informed her.

## 2023-11-30 NOTE — Addendum Note (Signed)
 Addended by: Eldred Manges on: 11/30/2023 02:54 PM   Modules accepted: Orders

## 2023-11-30 NOTE — Telephone Encounter (Signed)
 Ok to order bone density test for [facility] location (dx postmenopausal estrogen deficiency)

## 2023-11-30 NOTE — Progress Notes (Signed)
 Subjective:   Kristen Huffman is a 78 y.o. female who presents for Medicare Annual (Subsequent) preventive examination.  Visit Complete: Virtual I connected with  [name] on 11/30/23 by a audio enabled telemedicine application and verified that I am speaking with the correct person using two identifiers.  Patient Location: Home  Provider Location: Home Office  I discussed the limitations of evaluation and management by telemedicine. The patient expressed understanding and agreed to proceed.  Vital Signs: Because this visit was a virtual/telehealth visit, some criteria may be missing or patient reported. Any vitals not documented were not able to be obtained and vitals that have been documented are patient reported.   Cardiac Risk Factors include: advanced age (>34men, >77 women)     Objective:    There were no vitals filed for this visit. There is no height or weight on file to calculate BMI.     11/30/2023    3:52 PM 10/21/2022    9:19 AM 09/04/2021    3:57 PM 08/05/2020    3:09 PM 05/11/2020   12:33 PM 06/16/2017    8:23 AM 04/11/2017   11:32 AM  Advanced Directives  Does Patient Have a Medical Advance Directive? Yes Yes Yes Yes Yes Yes No  Type of Estate agent of [employer] Power of [place];Living will Healthcare Power of [place];Living will Healthcare Power of [place];Living will  Living will;Healthcare Power of Attorney   Copy of Healthcare Power of Attorney in Chart? Yes - validated most recent copy scanned in chart (See row information) No - copy requested No - copy requested No - copy requested  No - copy requested     Current Medications (verified) Outpatient Encounter Medications as of 11/30/2023  Medication Sig   ALPRAZolam (XANAX) 0.5 MG tablet Take 1 tablet (0.5 mg total) by mouth 2 (two) times daily as needed for anxiety.   Ascorbic Acid (VITAMIN C) 100 MG tablet Take 100 mg by mouth daily.   atorvastatin (LIPITOR) 10 MG tablet Take 1  tablet (10 mg total) by mouth daily.   azelastine (ASTELIN) 0.1 % nasal spray Place 1 spray into both nostrils 2 (two) times daily. Use in each nostril as directed   Biotin 300 MCG TABS Take 400 mcg by mouth.   cetirizine (ZYRTEC) 10 MG tablet Take 10 mg by mouth daily.   cholecalciferol (VITAMIN D3) 25 MCG (1000 UNIT) tablet Take 100 Units by mouth daily.   Cyanocobalamin (VITAMIN B-12 PO) Take 13.5 mg by mouth.   Multiple Vitamin (MULTIVITAMIN) tablet Take 1 tablet by mouth daily.   predniSONE (DELTASONE) 10 MG tablet 3 tabs x3 days and then 2 tabs x3 days and then 1 tab x3 days.  Take w/ food.   thiamine 50 MG tablet Take 4 mg by mouth daily.   VITAMIN A PO Take 50 mg by mouth daily.   Vitamin E 100 units TABS Take 10 mg by mouth daily.   vitamin k 100 MCG tablet Take 100 mcg by mouth daily.   Zinc Sulfate (ZINC 15 PO) Take 16 mg by mouth.   No facility-administered encounter medications on file as of 11/30/2023.    Allergies (verified) Tetanus toxoids, Codeine, Novocain [procaine], and Penicillins   History: Past Medical History:  Diagnosis Date   Dental crown present    Extensor tenosynovitis of left wrist 01/2015   History of hepatitis A    Hyperlipidemia    Past Surgical History:  Procedure Laterality Date   DORSAL COMPARTMENT  RELEASE Left 02/22/2015   Procedure: RELEASE LEFT FIRST DORSAL COMPARTMENT (DEQUERVAIN) AND RADIAL TENOSYNOVECTOMY;  Surgeon: Dominica Severin, MD;  Location: [facility];  Service: Orthopedics;  Laterality: Left;   EYE FOREIGN BODY REMOVAL     INCONTINENCE SURGERY     KNEE ARTHROSCOPY W/ MENISCAL REPAIR Right    REPAIR ANKLE LIGAMENT Left    SHOULDER ARTHROSCOPY WITH LABRAL REPAIR Right 02/10/2022   TENDON REPAIR Left    wrist   Family History  Problem Relation Age of Onset   Heart attack Father 50   Hypertension Mother    Heart failure Mother    Social History   Socioeconomic History   Marital status: Widowed    Spouse name:  Not on file   Number of children: 2   Years of education: Not on file   Highest education level: Associate degree: occupational, Scientist, product/process development, or vocational program  Occupational History   Not on file  Tobacco Use   Smoking status: Former    Current packs/day: 0.00    Average packs/day: 0.5 packs/day for 15.0 years (7.5 ttl pk-yrs)    Types: Cigarettes    Start date: 09/28/1964    Quit date: 09/29/1979    Years since quitting: 44.2   Smokeless tobacco: Never  Substance and Sexual Activity   Alcohol use: No    Alcohol/week: 0.0 standard drinks of alcohol   Drug use: No   Sexual activity: Not on file  Other Topics Concern   Not on file  Social History Narrative   Not on file   Social Drivers of Health   Financial Resource Strain: Low Risk  (11/30/2023)   Overall Financial Resource Strain (CARDIA)    Difficulty of Paying Living Expenses: Not hard at all  Food Insecurity: No Food Insecurity (11/30/2023)   Hunger Vital Sign    Worried About Running Out of Food in the Last Year: Never true    Ran Out of Food in the Last Year: Never true  Transportation Needs: No Transportation Needs (11/30/2023)   PRAPARE - Administrator, Civil Service (Medical): No    Lack of Transportation (Non-Medical): No  Physical Activity: Insufficiently Active (11/30/2023)   Exercise Vital Sign    Days of Exercise per Week: 2 days    Minutes of Exercise per Session: 20 min  Stress: Stress Concern Present (11/30/2023)   [employer] of Occupational Health - Occupational Stress Questionnaire    Feeling of Stress : To some extent  Social Connections: Moderately Isolated (11/30/2023)   Social Connection and Isolation Panel [NHANES]    Frequency of Communication with Friends and Family: More than three times a week    Frequency of Social Gatherings with Friends and Family: More than three times a week    Attends Religious Services: More than 4 times per year    Active Member of [employer] or Organizations: No     Attends Banker Meetings: Never    Marital Status: Widowed    Tobacco Counseling Counseling given: Not Answered   Clinical Intake:  Pre-visit preparation completed: Yes  Pain : No/denies pain     Diabetes: No  How often do you need to have someone help you when you read instructions, pamphlets, or other written materials from your doctor or pharmacy?: 1 - Never  Interpreter Needed?: No  Information entered by :: Remi Haggard LPN   Activities of Daily Living    11/30/2023    3:53 PM  In your  present state of health, do you have any difficulty performing the following activities:  Hearing? 0  Vision? 0  Difficulty concentrating or making decisions? 0  Walking or climbing stairs? 0  Dressing or bathing? 0  Doing errands, shopping? 0  Preparing Food and eating ? N  Using the Toilet? N  In the past six months, have you accidently leaked urine? N  Do you have problems with loss of bowel control? N  Managing your Medications? N  Managing your Finances? N  Housekeeping or managing your Housekeeping? N    Patient Care Team: Sheliah Hatch, MD as PCP - General (Family Medicine) Jeani Hawking, MD as Consulting Physician (Gastroenterology) Ola Spurr., MD as Consulting Physician (Obstetrics and Gynecology) Dominica Severin, MD as Consulting Physician (Orthopedic Surgery) Mat Carne, DO as Consulting Physician (Optometry) Puschinsky, Adelfa Koh., MD as Consulting Physician (Urology) Leola Brazil (Dermatology)  Indicate any recent Medical Services you may have received from other than [facility] providers in the past year (date may be approximate).     Assessment:   This is a routine wellness examination for Aryan.  Hearing/Vision screen Hearing Screening - Comments:: No trouble hearing Vision Screening - Comments:: Up to date Jimmey Ralph   Goals Addressed               This Visit's Progress     Patient Stated (pt-stated)   On track     Drink  more water & increase activity.      Patient Stated        Maintain current lifestyle        Depression Screen    11/30/2023    4:00 PM 10/06/2023    8:45 AM 03/17/2023   10:19 AM 10/21/2022    9:14 AM 09/10/2022    8:54 AM 03/06/2022   10:16 AM 09/05/2021    8:54 AM  PHQ 2/9 Scores  PHQ - 2 Score 0 0 0 0 0 0 0  PHQ- 9 Score 0 0 0 0 0 0 0    Fall Risk    11/30/2023    3:52 PM 11/10/2023    8:21 AM 10/06/2023    8:45 AM 03/17/2023   10:19 AM 10/21/2022    9:17 AM  Fall Risk   Falls in the past year? 0 0 0 0 1  Number falls in past yr: 0 0 0 0 0  Injury with Fall? 0 0 0 0 1  Comment     Fx 2 fingers on rt hand. Followed by medical attention  Risk for fall due to :   No Fall Risks No Fall Risks Impaired balance/gait  Follow up Falls evaluation completed;Education provided;Falls prevention discussed   Falls evaluation completed Falls prevention discussed    MEDICARE RISK AT HOME: Medicare Risk at Home Any stairs in or around the home?: No If so, are there any without handrails?: No Home free of loose throw rugs in walkways, pet beds, electrical cords, etc?: Yes Adequate lighting in your home to reduce risk of falls?: Yes Life alert?: No Use of a cane, walker or w/c?: No Grab bars in the bathroom?: Yes Shower chair or bench in shower?: No Elevated toilet seat or a handicapped toilet?: Yes  TIMED UP AND GO:  Was the test performed?  No    Cognitive Function:        11/30/2023    4:01 PM 11/30/2023    3:54 PM 10/21/2022    9:20 AM  6CIT Screen  What Year? 0 points 0 points 0 points  What month? 0 points 0 points 0 points  What time? 0 points 0 points 0 points  Count back from 20 0 points 0 points 0 points  Months in reverse 0 points 0 points 0 points  Repeat phrase 0 points 0 points 0 points  Total Score 0 points 0 points 0 points    Immunizations Immunization History  Administered Date(s) Administered   Fluad Quad(high Dose 65+) 06/07/2019   Influenza Split  09/03/2011   Influenza, High Dose Seasonal PF 06/16/2017, 06/29/2018, 06/07/2019, 07/19/2020, 08/12/2021   Influenza, Seasonal, Injecte, Preservative Fre 09/06/2012   Influenza,inj,Quad PF,6+ Mos 09/25/2013, 05/28/2014, 06/10/2015, 06/10/2016, 06/29/2018   Influenza-Unspecified 07/15/2019   MODERNA COVID-19 SARS-COV-2 PEDS BIVALENT BOOSTER 34yr-91yr 12/04/2020   Moderna Sars-Covid-2 Vaccination 10/19/2019, 11/17/2019, 07/20/2020   Pneumococcal Conjugate-13 05/28/2014   Pneumococcal Polysaccharide-23 06/10/2015   Zoster Recombinant(Shingrix) 06/16/2017, 09/08/2017   Zoster, Live 03/07/2012    TDAP status: Due, Education has been provided regarding the importance of this vaccine. Advised may receive this vaccine at local pharmacy or Health Dept. Aware to provide a copy of the vaccination record if obtained from local pharmacy or Health Dept. Verbalized acceptance and understanding.  Flu Vaccine status: Declined, Education has been provided regarding the importance of this vaccine but patient still declined. Advised may receive this vaccine at local pharmacy or Health Dept. Aware to provide a copy of the vaccination record if obtained from local pharmacy or Health Dept. Verbalized acceptance and understanding.  Pneumococcal vaccine status: Up to date  Covid-19 vaccine status: Information provided on how to obtain vaccines.   Qualifies for Shingles Vaccine? Yes   Zostavax completed No   Shingrix Completed?: No.    Education has been provided regarding the importance of this vaccine. Patient has been advised to call insurance company to determine out of pocket expense if they have not yet received this vaccine. Advised may also receive vaccine at local pharmacy or Health Dept. Verbalized acceptance and understanding.  Screening Tests Health Maintenance  Topic Date Due   DTaP/Tdap/Td (1 - Tdap) Never done   COVID-19 Vaccine (5 - 2024-25 season) 05/30/2023   MAMMOGRAM  10/21/2023   INFLUENZA  VACCINE  12/27/2023 (Originally 04/29/2023)   Medicare Annual Wellness (AWV)  11/29/2024   Pneumonia Vaccine 21+ Years old  Completed   DEXA SCAN  Completed   Hepatitis C Screening  Completed   Zoster Vaccines- Shingrix  Completed   HPV VACCINES  Aged Out   Colonoscopy  Discontinued    Health Maintenance  Health Maintenance Due  Topic Date Due   DTaP/Tdap/Td (1 - Tdap) Never done   COVID-19 Vaccine (5 - 2024-25 season) 05/30/2023   MAMMOGRAM  10/21/2023    Colonoscopy   ordered Cologuard   Mammogram status: Completed  . Repeat every year  Bone Density  Ordered  Lung Cancer Screening: (Low Dose CT Chest recommended if Age 37-80 years, 20 pack-year currently smoking OR have quit w/in 15years.) does not qualify.   Lung Cancer Screening Referral:   Additional Screening:  Hepatitis C Screening: does not qualify; Completed Hep C  Vision Screening: Recommended annual ophthalmology exams for early detection of glaucoma and other disorders of the eye. Is the patient up to date with their annual eye exam?  Yes  Who is the provider or what is the name of the office in which the patient attends annual eye exams? [facility] If pt is not established with a provider, would they  like to be referred to a provider to establish care? No .   Dental Screening: Recommended annual dental exams for proper oral hygiene    Community Resource Referral / Chronic Care Management: CRR required this visit?  No   CCM required this visit?  No     Plan:     I have personally reviewed and noted the following in the patient's chart:   Medical and social history Use of alcohol, tobacco or illicit drugs  Current medications and supplements including opioid prescriptions. Patient is not currently taking opioid prescriptions. Functional ability and status Nutritional status Physical activity Advanced directives List of other physicians Hospitalizations, surgeries, and ER visits in previous 12  months Vitals Screenings to include cognitive, depression, and falls Referrals and appointments  In addition, I have reviewed and discussed with patient certain preventive protocols, quality metrics, and best practice recommendations. A written personalized care plan for preventive services as well as general preventive health recommendations were provided to patient.     Remi Haggard, LPN   09/28/9145   After Visit Summary: (MyChart) Due to this being a telephonic visit, the after visit summary with patients personalized plan was offered to patient via MyChart   Nurse Notes:

## 2023-12-09 ENCOUNTER — Ambulatory Visit (HOSPITAL_BASED_OUTPATIENT_CLINIC_OR_DEPARTMENT_OTHER)
Admission: RE | Admit: 2023-12-09 | Discharge: 2023-12-09 | Disposition: A | Discharge status: Home / Self Care | Source: Ambulatory Visit | Attending: Family Medicine | Admitting: Family Medicine

## 2023-12-09 ENCOUNTER — Encounter (HOSPITAL_BASED_OUTPATIENT_CLINIC_OR_DEPARTMENT_OTHER): Payer: Self-pay

## 2023-12-09 ENCOUNTER — Ambulatory Visit (HOSPITAL_BASED_OUTPATIENT_CLINIC_OR_DEPARTMENT_OTHER)
Admission: RE | Admit: 2023-12-09 | Discharge: 2023-12-09 | Disposition: A | Discharge status: Home / Self Care | Payer: Medicare PPO | Source: Ambulatory Visit | Attending: Family Medicine | Admitting: Family Medicine

## 2023-12-09 ENCOUNTER — Encounter: Payer: Self-pay | Admitting: Family Medicine

## 2023-12-09 DIAGNOSIS — Z1231 Encounter for screening mammogram for malignant neoplasm of breast: Secondary | ICD-10-CM | POA: Insufficient documentation

## 2023-12-09 DIAGNOSIS — Z780 Asymptomatic menopausal state: Secondary | ICD-10-CM | POA: Insufficient documentation

## 2023-12-09 DIAGNOSIS — M85851 Other specified disorders of bone density and structure, right thigh: Secondary | ICD-10-CM | POA: Diagnosis not present

## 2023-12-09 DIAGNOSIS — Z1211 Encounter for screening for malignant neoplasm of colon: Secondary | ICD-10-CM | POA: Diagnosis not present

## 2023-12-16 ENCOUNTER — Encounter: Payer: Self-pay | Admitting: Family Medicine

## 2023-12-16 LAB — COLOGUARD: COLOGUARD: NEGATIVE

## 2023-12-16 NOTE — Telephone Encounter (Signed)
 Lab results have been discussed.   Verbalized understanding? Yes  Are there any questions?  Patient is wondering if mammogram results are in?    I did let patients know the results for the mammogram are in and that Dr.Tabori has probably not had a chance to look over them but I would send a message over to her about this.

## 2023-12-16 NOTE — Telephone Encounter (Signed)
-----   Message from Neena Rhymes sent at 12/16/2023  1:42 PM EDT ----- Normal cologuard- great news!

## 2024-02-26 ENCOUNTER — Other Ambulatory Visit: Payer: Self-pay

## 2024-02-26 ENCOUNTER — Emergency Department (HOSPITAL_BASED_OUTPATIENT_CLINIC_OR_DEPARTMENT_OTHER)

## 2024-02-26 ENCOUNTER — Emergency Department (HOSPITAL_BASED_OUTPATIENT_CLINIC_OR_DEPARTMENT_OTHER)
Admission: EM | Admit: 2024-02-26 | Discharge: 2024-02-26 | Disposition: A | Discharge status: Home / Self Care | Attending: Emergency Medicine | Admitting: Emergency Medicine

## 2024-02-26 ENCOUNTER — Encounter (HOSPITAL_BASED_OUTPATIENT_CLINIC_OR_DEPARTMENT_OTHER): Payer: Self-pay | Admitting: Emergency Medicine

## 2024-02-26 DIAGNOSIS — W1809XA Striking against other object with subsequent fall, initial encounter: Secondary | ICD-10-CM | POA: Diagnosis not present

## 2024-02-26 DIAGNOSIS — Z87891 Personal history of nicotine dependence: Secondary | ICD-10-CM | POA: Insufficient documentation

## 2024-02-26 DIAGNOSIS — S0990XA Unspecified injury of head, initial encounter: Secondary | ICD-10-CM | POA: Insufficient documentation

## 2024-02-26 DIAGNOSIS — S199XXA Unspecified injury of neck, initial encounter: Secondary | ICD-10-CM | POA: Diagnosis not present

## 2024-02-26 DIAGNOSIS — R9082 White matter disease, unspecified: Secondary | ICD-10-CM | POA: Diagnosis not present

## 2024-02-26 DIAGNOSIS — M542 Cervicalgia: Secondary | ICD-10-CM | POA: Diagnosis not present

## 2024-02-26 DIAGNOSIS — R519 Headache, unspecified: Secondary | ICD-10-CM | POA: Diagnosis not present

## 2024-02-26 DIAGNOSIS — W19XXXA Unspecified fall, initial encounter: Secondary | ICD-10-CM

## 2024-02-26 DIAGNOSIS — Z043 Encounter for examination and observation following other accident: Secondary | ICD-10-CM | POA: Diagnosis not present

## 2024-02-26 MED ORDER — KETOROLAC TROMETHAMINE 15 MG/ML IJ SOLN
15.0000 mg | Freq: Once | INTRAMUSCULAR | Status: AC
  Administered 2024-02-26: 15 mg via INTRAMUSCULAR
  Filled 2024-02-26: qty 1

## 2024-02-26 NOTE — ED Triage Notes (Signed)
 Pt was standing on toilet to fix a blind when she fell backward; landed on buttocks on brick floor and hit head on tub; no blood thinners, denies LOC

## 2024-02-26 NOTE — Discharge Instructions (Signed)
 We evaluated you for your fall.  Your x-rays and CT scans were negative for fracture or injury.  You may have some soreness tomorrow or headache.  It is hard to say if you will have a concussion, this sometimes takes few days to develop.  If you do have headaches or dizziness, try to avoid anything that worsens your headache.  Please take Tylenol (acetaminophen) and Motrin  (ibuprofen ) for your symptoms at home.  You can take 1000 mg of Tylenol every 6 hours and 600 mg of Motrin  every 6 hours as needed for your symptoms.  You can take these medicines together as needed, either at the same time, or alternating every 3 hours.  Follow-up closely with your primary doctor.  If you have any new or worsening symptoms such as uncontrolled or severe headache, vomiting, vision changes, trouble walking, or any other new symptoms, please return to the emergency department.

## 2024-02-26 NOTE — ED Notes (Signed)
 Pt provided discharge instructions and prescription information. Pt was given the opportunity to ask questions and questions were answered.

## 2024-02-26 NOTE — ED Provider Notes (Signed)
 [facility] Provider Note  CSN: [id number] Arrival date & time: 02/26/24 1752  Chief Complaint(s) Fall  HPI Kristen Huffman is a 78 y.o. female with history of hyperlipidemia presenting to the emergency department after head injury.  The patient was fixing a blind when she fell backward, landed on her bottom, hit her head against the ground.  No loss of consciousness.  No nausea or vomiting.  No pain in arms or legs.  Has been ambulatory.  Mild headache.  Mild pain to the bottom where she fell on her bottom.   Past Medical History Past Medical History:  Diagnosis Date   Dental crown present    Extensor tenosynovitis of left wrist 01/2015   History of hepatitis A    Hyperlipidemia    Patient Active Problem List   Diagnosis Date Noted   Microscopic hematuria 03/06/2022   Postmenopausal estrogen deficiency 12/02/2015   Physical exam 03/07/2012   Hyperlipidemia 02/10/2011   Allergic rhinitis 02/10/2011   Home Medication(s) Prior to Admission medications   Medication Sig Start Date End Date Taking? Authorizing Provider  ALPRAZolam  (XANAX ) 0.5 MG tablet Take 1 tablet (0.5 mg total) by mouth 2 (two) times daily as needed for anxiety. 03/17/23   Tabori, Katherine E, MD  Ascorbic Acid (VITAMIN C) 100 MG tablet Take 100 mg by mouth daily.    [provider]  atorvastatin  (LIPITOR) 10 MG tablet Take 1 tablet (10 mg total) by mouth daily. 03/17/23   Tabori, Katherine E, MD  azelastine  (ASTELIN ) 0.1 % nasal spray Place 1 spray into both nostrils 2 (two) times daily. Use in each nostril as directed 03/17/23   Tabori, Katherine E, MD  Biotin 300 MCG TABS Take 400 mcg by mouth.    [provider]  cetirizine (ZYRTEC) 10 MG tablet Take 10 mg by mouth daily.    [provider]  cholecalciferol (VITAMIN D3) 25 MCG (1000 UNIT) tablet Take 100 Units by mouth daily.    [provider]  Cyanocobalamin (VITAMIN B-12 PO) Take 13.5  mg by mouth.    [provider]  Multiple Vitamin (MULTIVITAMIN) tablet Take 1 tablet by mouth daily.    [provider]  predniSONE  (DELTASONE ) 10 MG tablet 3 tabs x3 days and then 2 tabs x3 days and then 1 tab x3 days.  Take w/ food. 11/10/23   Tabori, Katherine E, MD  thiamine 50 MG tablet Take 4 mg by mouth daily.    [provider]  VITAMIN A PO Take 50 mg by mouth daily.    [provider]  Vitamin E 100 units TABS Take 10 mg by mouth daily.    [provider]  vitamin k 100 MCG tablet Take 100 mcg by mouth daily.    [provider]  Zinc Sulfate (ZINC 15 PO) Take 16 mg by mouth.    [provider]  Past Surgical History Past Surgical History:  Procedure Laterality Date   DORSAL COMPARTMENT RELEASE Left 02/22/2015   Procedure: RELEASE LEFT FIRST DORSAL COMPARTMENT (DEQUERVAIN) AND RADIAL TENOSYNOVECTOMY;  Surgeon: Ronn Cohn, MD;  Location: [facility];  Service: Orthopedics;  Laterality: Left;   EYE FOREIGN BODY REMOVAL     INCONTINENCE SURGERY     KNEE ARTHROSCOPY W/ MENISCAL REPAIR Right    REPAIR ANKLE LIGAMENT Left    SHOULDER ARTHROSCOPY WITH LABRAL REPAIR Right 02/10/2022   TENDON REPAIR Left    wrist   Family History Family History  Problem Relation Age of Onset   Heart attack Father 9   Hypertension Mother    Heart failure Mother     Social History Social History   Tobacco Use   Smoking status: Former    Current packs/day: 0.00    Average packs/day: 0.5 packs/day for 15.0 years (7.5 ttl pk-yrs)    Types: Cigarettes    Start date: 09/28/1964    Quit date: 09/29/1979    Years since quitting: 44.4   Smokeless tobacco: Never  Substance Use Topics   Alcohol use: No    Alcohol/week: 0.0 standard drinks of alcohol   Drug use: No   Allergies Tetanus toxoids,  Codeine, Novocain [procaine], and Penicillins  Review of Systems Review of Systems  All other systems reviewed and are negative.   Physical Exam Vital Signs  I have reviewed the triage vital signs BP 137/85   Pulse 76   Temp 98.5 F (36.9 C) (Oral)   Resp 20   Ht 5\' 5"  (1.651 m)   Wt 62.3 kg   SpO2 98%   BMI 22.86 kg/m  Physical Exam Vitals and nursing note reviewed.  Constitutional:      General: She is not in acute distress.    Appearance: She is well-developed.  HENT:     Head: Normocephalic and atraumatic.     Mouth/Throat:     Mouth: Mucous membranes are moist.  Eyes:     Pupils: Pupils are equal, round, and reactive to light.  Cardiovascular:     Rate and Rhythm: Normal rate and regular rhythm.     Heart sounds: No murmur heard. Pulmonary:     Effort: Pulmonary effort is normal. No respiratory distress.     Breath sounds: Normal breath sounds.  Abdominal:     General: Abdomen is flat.     Palpations: Abdomen is soft.     Tenderness: There is no abdominal tenderness.  Musculoskeletal:        General: No tenderness.     Right lower leg: No edema.     Left lower leg: No edema.     Comments: No midline C, T, L-spine tenderness. Mild right paraspinal cervical tenderness. No chest wall tenderness or crepitus.  Full painless range of motion at the bilateral upper extremities including the shoulders, elbows, wrists, hand and fingers, and in the bilateral lower extremities including the hips, knees, ankle, toes.  No focal bony tenderness, injury or deformity.   Skin:    General: Skin is warm and dry.  Neurological:     General: No focal deficit present.     Mental Status: She is alert. Mental status is at baseline.  Psychiatric:        Mood and Affect: Mood normal.        Behavior: Behavior normal.     ED Results and Treatments Labs (all labs ordered are listed, but only abnormal results  are displayed) Labs Reviewed - No data to display                                                                                                                         Radiology DG Sacrum/Coccyx Result Date: 02/26/2024 CLINICAL DATA:  Patient fell onto the tail bone. EXAM: SACRUM AND COCCYX - 2+ VIEW COMPARISON:  None Available. FINDINGS: Visualization of the sacrum is somewhat limited due to overlying bowel gas and stool. There is no evidence of fracture or other focal bone lesions. SI joints are symmetrical without displacement. Sacral struts appear intact. Normal alignment of the sacral coccygeal spine. IMPRESSION: Negative. Electronically Signed   By: Boyce Byes M.D.   On: 02/26/2024 18:55   CT Head Wo Contrast Result Date: 02/26/2024 CLINICAL DATA:  Head and neck trauma EXAM: CT HEAD WITHOUT CONTRAST CT CERVICAL SPINE WITHOUT CONTRAST TECHNIQUE: Multidetector CT imaging of the head and cervical spine was performed following the standard protocol without intravenous contrast. Multiplanar CT image reconstructions of the cervical spine were also generated. RADIATION DOSE REDUCTION: This exam was performed according to the departmental dose-optimization program which includes automated exposure control, adjustment of the mA and/or kV according to patient size and/or use of iterative reconstruction technique. COMPARISON:  None Available. FINDINGS: CT HEAD FINDINGS Brain: No evidence of acute infarction, hemorrhage, hydrocephalus, extra-axial collection or mass lesion/mass effect. Periventricular white matter hypodensity. Vascular: No hyperdense vessel or unexpected calcification. Skull: Normal. Negative for fracture or focal lesion. Sinuses/Orbits: No acute finding. Other: None. CT CERVICAL SPINE FINDINGS Alignment: Straightening of the normal cervical lordosis. Skull base and vertebrae: No acute fracture. No primary bone lesion or focal pathologic process. Soft tissues and spinal canal: No prevertebral fluid or swelling. No visible canal hematoma. Disc levels: Mild disc  space height loss and osteophytosis of the lower cervical levels Upper chest: Negative. Other: None. IMPRESSION: 1. No acute intracranial pathology. Small-vessel white matter disease. 2. No fracture or static subluxation of the cervical spine. 3. Mild disc space height loss and osteophytosis of the lower cervical levels. Electronically Signed   By: Fredricka Jenny M.D.   On: 02/26/2024 18:52   CT Cervical Spine Wo Contrast Result Date: 02/26/2024 CLINICAL DATA:  Head and neck trauma EXAM: CT HEAD WITHOUT CONTRAST CT CERVICAL SPINE WITHOUT CONTRAST TECHNIQUE: Multidetector CT imaging of the head and cervical spine was performed following the standard protocol without intravenous contrast. Multiplanar CT image reconstructions of the cervical spine were also generated. RADIATION DOSE REDUCTION: This exam was performed according to the departmental dose-optimization program which includes automated exposure control, adjustment of the mA and/or kV according to patient size and/or use of iterative reconstruction technique. COMPARISON:  None Available. FINDINGS: CT HEAD FINDINGS Brain: No evidence of acute infarction, hemorrhage, hydrocephalus, extra-axial collection or mass lesion/mass effect. Periventricular white matter hypodensity. Vascular: No hyperdense vessel or unexpected calcification. Skull: Normal. Negative for fracture or focal lesion. Sinuses/Orbits: No acute finding. Other: None. CT CERVICAL SPINE FINDINGS  Alignment: Straightening of the normal cervical lordosis. Skull base and vertebrae: No acute fracture. No primary bone lesion or focal pathologic process. Soft tissues and spinal canal: No prevertebral fluid or swelling. No visible canal hematoma. Disc levels: Mild disc space height loss and osteophytosis of the lower cervical levels Upper chest: Negative. Other: None. IMPRESSION: 1. No acute intracranial pathology. Small-vessel white matter disease. 2. No fracture or static subluxation of the cervical  spine. 3. Mild disc space height loss and osteophytosis of the lower cervical levels. Electronically Signed   By: Fredricka Jenny M.D.   On: 02/26/2024 18:52    Pertinent labs & imaging results that were available during my care of the patient were reviewed by me and considered in my medical decision making (see MDM for details).  Medications Ordered in ED Medications  ketorolac (TORADOL) 15 MG/ML injection 15 mg (has no administration in time range)                                                                                                                                     Procedures Procedures  (including critical care time)  Medical Decision Making / ED Course   MDM:  78 year old presenting after fall.  Patient overall well-appearing, examination unremarkable.  Imaging obtained of the head and neck, with no evidence of any traumatic injury.  She also had pain in her sacral region.  X-ray was obtained with no evidence of sacral or coccygeal fracture.  She has been ambulatory and without signs of other trauma.  Discussed concussion precautions should she develop concussion at home. Will discharge patient to home. All questions answered. Patient comfortable with plan of discharge. Return precautions discussed with patient and specified on the after visit summary.       Additional history obtained: -Additional history obtained from family       Imaging Studies ordered: I ordered imaging studies including CT head and neck On my interpretation imaging demonstrates no fracture I independently visualized and interpreted imaging. I agree with the radiologist interpretation   Medicines ordered and prescription drug management: Meds ordered this encounter  Medications   ketorolac (TORADOL) 15 MG/ML injection 15 mg    -I have reviewed the patients home medicines and have made adjustments as needed    Reevaluation: After the interventions noted above, I reevaluated the  patient and found that their symptoms have improved  Co morbidities that complicate the patient evaluation  Past Medical History:  Diagnosis Date   Dental crown present    Extensor tenosynovitis of left wrist 01/2015   History of hepatitis A    Hyperlipidemia       Dispostion: Disposition decision including need for hospitalization was considered, and patient discharged from emergency department.    Final Clinical Impression(s) / ED Diagnoses Final diagnoses:  Fall, initial encounter     This chart was dictated using voice recognition software.  Despite best efforts  to proofread,  errors can occur which can change the documentation meaning.    Mordecai Applebaum, MD 02/26/24 2045

## 2024-03-10 ENCOUNTER — Telehealth: Payer: Self-pay | Admitting: Family Medicine

## 2024-03-10 NOTE — Telephone Encounter (Unsigned)
 Copied from CRM [id number]. Topic: Appointments - Scheduling Inquiry for Clinic >> Mar 09, 2024  3:15 PM Kristen Huffman wrote: Reason for CRM: Patient is calling because she would like to speak with Dr. Paulla Huffman about her recent hospital visit - hospital follow-up is not available within 14 days. Patient is available tomorrow but after that she won't be free until Wednesday. She would specifically like to speak to Dr. Paulla Huffman if possible.   ----------------------------------------------------------------------- From previous Reason for Contact - Scheduling: Patient/patient representative is calling to schedule an appointment. Refer to attachments for appointment information.

## 2024-03-10 NOTE — Telephone Encounter (Signed)
 Pt was seen in ER on 5/31 but did not call until 13 days later (which is why there is not a HFU appt available w/in 14 days).  I have an appt on Monday at 1:40 if she would like to be seen or a true HFU appt on 6/19 (Thursday).  Unfortunately I don't have any available appts today.  If someone could please call her and get more information that would be much appreciated

## 2024-03-10 NOTE — Telephone Encounter (Signed)
 Patient is scheduled for Thursday 03/16/2024. Patient did state she has been in pain and is rotating tylenol and ibuprofen  as recommended by ED but states it does not seem to be helping the pain and is wondering if there was any other suggestions she could use to help ease the pain until her appointment?

## 2024-03-13 NOTE — Telephone Encounter (Signed)
 Spoke with pt and she states she is sore on buttock area still. Pt reports she is trying Tylenol and Ibuprofen  but is only taking 1 Tylenol at a time.

## 2024-03-13 NOTE — Telephone Encounter (Signed)
 To be effective, Tylenol needs to be at least 2 tabs and then Ibuprofen  should be 2-3 tabs 4 hrs later and then 4 hrs after that (8 hrs since the initial dose of Tylenol) she should take 2 more Tylenol.  She had xrays done of her tailbone and buttocks at time of injury and these were normal.  A bruised tailbone can take weeks to heal

## 2024-03-13 NOTE — Telephone Encounter (Signed)
 I'm not sure where she is having pain so I'm not sure what other advice to give.  I agree w/ alternating Tylenol and Ibuprofen  for pain relief.  She can also try heat or ice- whichever feels better.

## 2024-03-14 NOTE — Telephone Encounter (Signed)
 Pt notes she has been feeling much better the last few days, will try taking tylenol and ibuprofen  as directed. Will also keep appt Thursday

## 2024-03-16 ENCOUNTER — Encounter: Payer: Self-pay | Admitting: Family Medicine

## 2024-03-16 ENCOUNTER — Ambulatory Visit: Admitting: Family Medicine

## 2024-03-16 VITALS — BP 128/78 | HR 61 | Temp 98.7°F | Ht 65.0 in | Wt 138.5 lb

## 2024-03-16 DIAGNOSIS — S0990XA Unspecified injury of head, initial encounter: Secondary | ICD-10-CM

## 2024-03-16 DIAGNOSIS — W07XXXD Fall from chair, subsequent encounter: Secondary | ICD-10-CM | POA: Diagnosis not present

## 2024-03-16 DIAGNOSIS — W07XXXA Fall from chair, initial encounter: Secondary | ICD-10-CM

## 2024-03-16 DIAGNOSIS — S0990XD Unspecified injury of head, subsequent encounter: Secondary | ICD-10-CM

## 2024-03-16 DIAGNOSIS — Z8739 Personal history of other diseases of the musculoskeletal system and connective tissue: Secondary | ICD-10-CM | POA: Diagnosis not present

## 2024-03-16 DIAGNOSIS — M7918 Myalgia, other site: Secondary | ICD-10-CM

## 2024-03-16 NOTE — Progress Notes (Signed)
   Subjective:    Patient ID: Kristen Huffman, female    DOB: 02-21-1946, 78 y.o.   MRN: [id number]  HPI ER f/u- pt was seen on 5/31 after a fall.  She was fixing a blind when she fell backwards, landing on her bottom and hitting her head on the ground.  No LOC.  Was ambulatory immediately after.  Had mild HA and pain in her bottom.  CT had was negative, Xrays of sacrum and coccyx WNL.  Today pt reports feeling fine.  Pt reports that for 4-5 days she had considerable pain in her butt but this has resolved w/ use of tylenol and ibuprofen .  No lingering headache.  No dizziness.     Review of Systems For ROS see HPI     Objective:   Physical Exam Vitals reviewed.  Constitutional:      General: She is not in acute distress.    Appearance: Normal appearance. She is not ill-appearing.  HENT:     Head: Normocephalic and atraumatic.   Eyes:     Extraocular Movements: Extraocular movements intact.     Conjunctiva/sclera: Conjunctivae normal.    Cardiovascular:     Rate and Rhythm: Normal rate and regular rhythm.  Pulmonary:     Effort: Pulmonary effort is normal. No respiratory distress.   Musculoskeletal:        General: No tenderness or deformity.   Skin:    General: Skin is warm and dry.   Neurological:     General: No focal deficit present.     Mental Status: She is alert and oriented to person, place, and time.   Psychiatric:        Mood and Affect: Mood normal.        Behavior: Behavior normal.        Thought Content: Thought content normal.           Assessment & Plan:  Fall from standing on toilet w/ injury to head and buttock- new.  Pt is now thankfully pain free.  No residual headaches or dizziness.  No further work up needed.  Pt can stop tylenol and ibuprofen  if pain free.

## 2024-03-16 NOTE — Patient Instructions (Signed)
 Follow up as needed or as scheduled I'm so glad that you are feeling better!!! Make sure you stand on something sturdy and can keep your balance Call with any questions or concerns Stay Safe!  Stay Healthy! Enjoy your granddaughter!!!

## 2024-04-30 ENCOUNTER — Other Ambulatory Visit: Payer: Self-pay | Admitting: Family Medicine

## 2024-05-02 DIAGNOSIS — D225 Melanocytic nevi of trunk: Secondary | ICD-10-CM | POA: Diagnosis not present

## 2024-05-02 DIAGNOSIS — L2989 Other pruritus: Secondary | ICD-10-CM | POA: Diagnosis not present

## 2024-05-02 DIAGNOSIS — L738 Other specified follicular disorders: Secondary | ICD-10-CM | POA: Diagnosis not present

## 2024-05-02 DIAGNOSIS — L821 Other seborrheic keratosis: Secondary | ICD-10-CM | POA: Diagnosis not present

## 2024-05-02 DIAGNOSIS — L814 Other melanin hyperpigmentation: Secondary | ICD-10-CM | POA: Diagnosis not present

## 2024-05-02 DIAGNOSIS — X32XXXA Exposure to sunlight, initial encounter: Secondary | ICD-10-CM | POA: Diagnosis not present

## 2024-05-02 DIAGNOSIS — L578 Other skin changes due to chronic exposure to nonionizing radiation: Secondary | ICD-10-CM | POA: Diagnosis not present

## 2024-05-02 DIAGNOSIS — L820 Inflamed seborrheic keratosis: Secondary | ICD-10-CM | POA: Diagnosis not present

## 2024-05-02 DIAGNOSIS — L538 Other specified erythematous conditions: Secondary | ICD-10-CM | POA: Diagnosis not present

## 2024-05-15 DIAGNOSIS — L538 Other specified erythematous conditions: Secondary | ICD-10-CM | POA: Diagnosis not present

## 2024-05-15 DIAGNOSIS — L820 Inflamed seborrheic keratosis: Secondary | ICD-10-CM | POA: Diagnosis not present

## 2024-05-15 DIAGNOSIS — L2989 Other pruritus: Secondary | ICD-10-CM | POA: Diagnosis not present

## 2024-05-21 ENCOUNTER — Other Ambulatory Visit: Payer: Self-pay | Admitting: Family Medicine

## 2024-06-21 ENCOUNTER — Other Ambulatory Visit: Payer: Self-pay | Admitting: Family Medicine

## 2024-08-19 ENCOUNTER — Other Ambulatory Visit: Payer: Self-pay | Admitting: Family Medicine

## 2024-08-21 NOTE — Telephone Encounter (Signed)
 Requested Prescriptions   Pending Prescriptions Disp Refills   ALPRAZolam  (XANAX ) 0.5 MG tablet [Pharmacy Med Name: ALPRAZOLAM  0.5 MG TABLET] 30 tablet     Sig: TAKE 1 TABLET BY MOUTH 2 TIMES DAILY AS NEEDED FOR ANXIETY.     Date of patient request: 08/21/2024 Last office visit: 03/16/2024 Upcoming visit: Visit date not found Date of last refill: 03/17/2023 Last refill amount: 30

## 2024-08-22 DIAGNOSIS — H52223 Regular astigmatism, bilateral: Secondary | ICD-10-CM | POA: Diagnosis not present

## 2024-08-22 DIAGNOSIS — H5203 Hypermetropia, bilateral: Secondary | ICD-10-CM | POA: Diagnosis not present

## 2024-08-22 DIAGNOSIS — H25813 Combined forms of age-related cataract, bilateral: Secondary | ICD-10-CM | POA: Diagnosis not present

## 2024-08-22 DIAGNOSIS — H43813 Vitreous degeneration, bilateral: Secondary | ICD-10-CM | POA: Diagnosis not present

## 2024-08-22 DIAGNOSIS — H524 Presbyopia: Secondary | ICD-10-CM | POA: Diagnosis not present

## 2024-08-22 DIAGNOSIS — H04123 Dry eye syndrome of bilateral lacrimal glands: Secondary | ICD-10-CM | POA: Diagnosis not present

## 2024-08-22 DIAGNOSIS — H35723 Serous detachment of retinal pigment epithelium, bilateral: Secondary | ICD-10-CM | POA: Diagnosis not present

## 2024-08-23 ENCOUNTER — Telehealth: Payer: Self-pay

## 2024-08-23 NOTE — Telephone Encounter (Signed)
 Patient would like abs drawn prior to appt. Is it okay to wait until appt to get labs drawn?    Copied from CRM #[id number]. Topic: Clinical - Request for Lab/Test Order >> Aug 23, 2024  1:34 PM Paige D wrote: Reason for CRM:  Pt would like lab orders in states she due for her 6 month lab work. I seen no orders entered last labs were in January 2025. Please call pt to schedule when lab orders are entered.

## 2024-08-29 DIAGNOSIS — R339 Retention of urine, unspecified: Secondary | ICD-10-CM | POA: Diagnosis not present

## 2024-08-29 DIAGNOSIS — N3941 Urge incontinence: Secondary | ICD-10-CM | POA: Diagnosis not present

## 2024-09-04 NOTE — Telephone Encounter (Signed)
 Pt has been informed and will wait for visit in January

## 2024-09-04 NOTE — Telephone Encounter (Signed)
 She was due in July for an OV for a cholesterol recheck.  We can either schedule an appt to check cholesterol or she is due for her yearly physical in January and we can schedule this and do labs then.

## 2024-10-05 ENCOUNTER — Encounter: Payer: Self-pay | Admitting: Family Medicine

## 2024-10-05 ENCOUNTER — Ambulatory Visit (INDEPENDENT_AMBULATORY_CARE_PROVIDER_SITE_OTHER): Admitting: Family Medicine

## 2024-10-05 VITALS — BP 132/74 | HR 67 | Ht 65.0 in | Wt 132.6 lb

## 2024-10-05 DIAGNOSIS — E785 Hyperlipidemia, unspecified: Secondary | ICD-10-CM

## 2024-10-05 DIAGNOSIS — Z0000 Encounter for general adult medical examination without abnormal findings: Secondary | ICD-10-CM | POA: Diagnosis not present

## 2024-10-05 LAB — CBC WITH DIFFERENTIAL/PLATELET
Basophils Absolute: 0.1 K/uL (ref 0.0–0.1)
Basophils Relative: 1.2 % (ref 0.0–3.0)
Eosinophils Absolute: 0.1 K/uL (ref 0.0–0.7)
Eosinophils Relative: 2.2 % (ref 0.0–5.0)
HCT: 38.7 % (ref 36.0–46.0)
Hemoglobin: 13 g/dL (ref 12.0–15.0)
Lymphocytes Relative: 36.7 % (ref 12.0–46.0)
Lymphs Abs: 2.2 K/uL (ref 0.7–4.0)
MCHC: 33.7 g/dL (ref 30.0–36.0)
MCV: 86 fl (ref 78.0–100.0)
Monocytes Absolute: 0.5 K/uL (ref 0.1–1.0)
Monocytes Relative: 8.5 % (ref 3.0–12.0)
Neutro Abs: 3.1 K/uL (ref 1.4–7.7)
Neutrophils Relative %: 51.4 % (ref 43.0–77.0)
Platelets: 201 K/uL (ref 150.0–400.0)
RBC: 4.5 Mil/uL (ref 3.87–5.11)
RDW: 14 % (ref 11.5–15.5)
WBC: 6 K/uL (ref 4.0–10.5)

## 2024-10-05 LAB — LIPID PANEL
Cholesterol: 170 mg/dL (ref 28–200)
HDL: 49.6 mg/dL
LDL Cholesterol: 87 mg/dL (ref 10–99)
NonHDL: 120.38
Total CHOL/HDL Ratio: 3
Triglycerides: 168 mg/dL — ABNORMAL HIGH (ref 10.0–149.0)
VLDL: 33.6 mg/dL (ref 0.0–40.0)

## 2024-10-05 LAB — BASIC METABOLIC PANEL WITH GFR
BUN: 13 mg/dL (ref 6–23)
CO2: 31 meq/L (ref 19–32)
Calcium: 9.6 mg/dL (ref 8.4–10.5)
Chloride: 104 meq/L (ref 96–112)
Creatinine, Ser: 0.75 mg/dL (ref 0.40–1.20)
GFR: 77.42 mL/min
Glucose, Bld: 96 mg/dL (ref 70–99)
Potassium: 4 meq/L (ref 3.5–5.1)
Sodium: 141 meq/L (ref 135–145)

## 2024-10-05 LAB — HEPATIC FUNCTION PANEL
ALT: 13 U/L (ref 3–35)
AST: 20 U/L (ref 5–37)
Albumin: 4.3 g/dL (ref 3.5–5.2)
Alkaline Phosphatase: 67 U/L (ref 39–117)
Bilirubin, Direct: 0.1 mg/dL (ref 0.1–0.3)
Total Bilirubin: 0.5 mg/dL (ref 0.2–1.2)
Total Protein: 6.6 g/dL (ref 6.0–8.3)

## 2024-10-05 LAB — TSH: TSH: 1.53 u[IU]/mL (ref 0.35–5.50)

## 2024-10-05 NOTE — Assessment & Plan Note (Signed)
 Pt's PE WNL.  UTD on mammo, PNA.  Declines flu.  Check labs.  Anticipatory guidance provided.

## 2024-10-05 NOTE — Patient Instructions (Signed)
 Follow up in 1 year or as needed We'll notify you of your lab results and make any changes if needed Keep up the good work- you look great! Call with any questions or concerns Stay Safe!  Stay Healthy! Happy New Year!

## 2024-10-05 NOTE — Progress Notes (Signed)
" ° °  Subjective:    Patient ID: Kristen Huffman, female    DOB: 1946/01/19, 79 y.o.   MRN: [id number]  HPI CPE- UTD on mammo, PNA.  Declines flu  Health Maintenance  Topic Date Due   DTaP/Tdap/Td (1 - Tdap) Never done   Medicare Annual Wellness (AWV)  11/29/2024   COVID-19 Vaccine (5 - 2025-26 season) 10/21/2024 (Originally 05/29/2024)   Influenza Vaccine  12/26/2024 (Originally 04/28/2024)   Mammogram  12/08/2024   Pneumococcal Vaccine: 50+ Years  Completed   Bone Density Scan  Completed   Hepatitis C Screening  Completed   Zoster Vaccines- Shingrix  Completed   Meningococcal B Vaccine  Aged Out   Colonoscopy  Discontinued    Patient Care Team    Relationship Specialty Notifications Start End  Mahlon Comer BRAVO, MD PCP - General Family Medicine  11/13/11   Rollin Dover, MD Consulting Physician Gastroenterology  06/07/15   Teresa Manuelita RAMAN., MD Consulting Physician Obstetrics and Gynecology  06/07/15   Camella Fallow, MD Consulting Physician Orthopedic Surgery  06/07/15   Kennyth Cy RAMAN, DO Consulting Physician Optometry  06/07/15   Puschinsky, Charlie ORN., MD Consulting Physician Urology  06/10/15   Hugh Ivanoff  Dermatology  06/16/17       Review of Systems Patient reports no vision/ hearing changes, adenopathy,fever, weight change,  persistant/recurrent hoarseness , swallowing issues, chest pain, palpitations, edema, persistant/recurrent cough, hemoptysis, dyspnea (rest/exertional/paroxysmal nocturnal), gastrointestinal bleeding (melena, rectal bleeding), abdominal pain, significant heartburn, bowel changes, GU symptoms (dysuria, hematuria, incontinence), Gyn symptoms (abnormal  bleeding, pain),  syncope, focal weakness, memory loss, numbness & tingling, skin/hair/nail changes, abnormal bruising or bleeding, anxiety, or depression.     Objective:   Physical Exam General Appearance:    Alert, cooperative, no distress, appears stated age  Head:    Normocephalic, without obvious abnormality,  atraumatic  Eyes:    PERRL, conjunctiva/corneas clear, EOM's intact both eyes  Ears:    Normal TM's and external ear canals, both ears  Nose:   Nares normal, septum midline, mucosa normal, no drainage    or sinus tenderness  Throat:   Lips, mucosa, and tongue normal; teeth and gums normal  Neck:   Supple, symmetrical, trachea midline, no adenopathy;    Thyroid : no enlargement/tenderness/nodules  Back:     Symmetric, no curvature, ROM normal, no CVA tenderness  Lungs:     Clear to auscultation bilaterally, respirations unlabored  Chest Wall:    No tenderness or deformity   Heart:    Regular rate and rhythm, S1 and S2 normal, no murmur, rub   or gallop  Breast Exam:    Deferred to mammo  Abdomen:     Soft, non-tender, bowel sounds active all four quadrants,    no masses, no organomegaly  Genitalia:    Deferred  Rectal:    Extremities:   Extremities normal, atraumatic, no cyanosis or edema  Pulses:   2+ and symmetric all extremities  Skin:   Skin color, texture, turgor normal, no rashes or lesions  Lymph nodes:   Cervical, supraclavicular, and axillary nodes normal  Neurologic:   CNII-XII intact, normal strength, sensation and reflexes    throughout          Assessment & Plan:    "

## 2024-10-06 ENCOUNTER — Ambulatory Visit: Payer: Self-pay | Admitting: Family Medicine

## 2024-10-06 NOTE — Telephone Encounter (Unsigned)
 Copied from CRM #[id number]. Topic: Clinical - Lab/Test Results >> Oct 06, 2024  1:29 PM Chasity T wrote: Reason for CRM: patient called back to speak with cma, advised her of dr mahlon notes and she was in agree with it.

## 2024-10-17 ENCOUNTER — Encounter: Admitting: Family Medicine
